# Patient Record
Sex: Female | Born: 1975 | Hispanic: No | Marital: Married | Smoking: Never smoker
Health system: Southern US, Community
[De-identification: ages and names within clinical notes are randomized; demographics above are authoritative.]

## PROBLEM LIST (undated history)

## (undated) DIAGNOSIS — J302 Other seasonal allergic rhinitis: Secondary | ICD-10-CM

## (undated) DIAGNOSIS — E785 Hyperlipidemia, unspecified: Secondary | ICD-10-CM

## (undated) DIAGNOSIS — Z8742 Personal history of other diseases of the female genital tract: Secondary | ICD-10-CM

## (undated) DIAGNOSIS — D649 Anemia, unspecified: Secondary | ICD-10-CM

## (undated) DIAGNOSIS — F419 Anxiety disorder, unspecified: Secondary | ICD-10-CM

## (undated) DIAGNOSIS — R011 Cardiac murmur, unspecified: Secondary | ICD-10-CM

## (undated) HISTORY — DX: Hyperlipidemia, unspecified: E78.5

## (undated) HISTORY — PX: WISDOM TOOTH EXTRACTION: SHX21

## (undated) HISTORY — DX: Personal history of other diseases of the female genital tract: Z87.42

---

## 1998-06-05 ENCOUNTER — Emergency Department (HOSPITAL_COMMUNITY): Admission: EM | Admit: 1998-06-05 | Discharge: 1998-06-06 | Payer: Self-pay | Admitting: Emergency Medicine

## 2002-07-17 ENCOUNTER — Other Ambulatory Visit: Admission: RE | Admit: 2002-07-17 | Discharge: 2002-07-17 | Payer: Self-pay | Admitting: Obstetrics and Gynecology

## 2003-09-23 ENCOUNTER — Other Ambulatory Visit: Admission: RE | Admit: 2003-09-23 | Discharge: 2003-09-23 | Payer: Self-pay | Admitting: Obstetrics and Gynecology

## 2006-08-20 ENCOUNTER — Other Ambulatory Visit: Admission: RE | Admit: 2006-08-20 | Discharge: 2006-08-20 | Payer: Self-pay | Admitting: Obstetrics and Gynecology

## 2011-11-06 ENCOUNTER — Other Ambulatory Visit: Payer: Self-pay | Admitting: Family Medicine

## 2011-11-06 ENCOUNTER — Ambulatory Visit
Admission: RE | Admit: 2011-11-06 | Discharge: 2011-11-06 | Disposition: A | Discharge status: Home / Self Care | Payer: BC Managed Care – PPO | Source: Ambulatory Visit | Attending: Family Medicine | Admitting: Family Medicine

## 2011-11-06 DIAGNOSIS — R05 Cough: Secondary | ICD-10-CM

## 2011-12-06 ENCOUNTER — Other Ambulatory Visit: Payer: Self-pay | Admitting: Obstetrics and Gynecology

## 2011-12-06 DIAGNOSIS — N644 Mastodynia: Secondary | ICD-10-CM

## 2011-12-06 DIAGNOSIS — N6325 Unspecified lump in the left breast, overlapping quadrants: Secondary | ICD-10-CM

## 2011-12-07 ENCOUNTER — Ambulatory Visit
Admission: RE | Admit: 2011-12-07 | Discharge: 2011-12-07 | Disposition: A | Discharge status: Home / Self Care | Payer: BC Managed Care – PPO | Source: Ambulatory Visit | Attending: Obstetrics and Gynecology | Admitting: Obstetrics and Gynecology

## 2011-12-07 DIAGNOSIS — N644 Mastodynia: Secondary | ICD-10-CM

## 2012-01-16 ENCOUNTER — Ambulatory Visit (INDEPENDENT_AMBULATORY_CARE_PROVIDER_SITE_OTHER): Payer: Self-pay

## 2012-01-16 DIAGNOSIS — M542 Cervicalgia: Secondary | ICD-10-CM

## 2012-01-16 DIAGNOSIS — S46819A Strain of other muscles, fascia and tendons at shoulder and upper arm level, unspecified arm, initial encounter: Secondary | ICD-10-CM

## 2012-01-16 DIAGNOSIS — S139XXA Sprain of joints and ligaments of unspecified parts of neck, initial encounter: Secondary | ICD-10-CM

## 2012-01-16 DIAGNOSIS — M25519 Pain in unspecified shoulder: Secondary | ICD-10-CM

## 2012-01-31 ENCOUNTER — Telehealth: Payer: Self-pay

## 2012-01-31 NOTE — Telephone Encounter (Signed)
CALLED PT, HER NAME WASN'T ON VM. CD LEFT UP FRONT BUT I DIDN'T LEAVE MSG. NOT SURE IF IT WAS HERS OR NOT

## 2012-01-31 NOTE — Telephone Encounter (Signed)
.  [facility]  X-RAY REQUESTED LAST WEEK - NOT IN PICK UP DRAWER  PLEASE MAKE COPY PT HAS APPT AT FOUR PM

## 2012-02-23 DIAGNOSIS — Z0271 Encounter for disability determination: Secondary | ICD-10-CM

## 2012-12-10 ENCOUNTER — Ambulatory Visit: Payer: BC Managed Care – PPO | Admitting: Obstetrics and Gynecology

## 2012-12-11 ENCOUNTER — Ambulatory Visit: Payer: BC Managed Care – PPO | Admitting: Obstetrics and Gynecology

## 2013-01-13 ENCOUNTER — Ambulatory Visit: Payer: BC Managed Care – PPO | Admitting: Obstetrics and Gynecology

## 2013-01-21 ENCOUNTER — Ambulatory Visit: Payer: BC Managed Care – PPO | Admitting: Obstetrics and Gynecology

## 2013-01-28 ENCOUNTER — Ambulatory Visit: Payer: BC Managed Care – PPO | Admitting: Obstetrics and Gynecology

## 2013-01-28 ENCOUNTER — Encounter: Payer: Self-pay | Admitting: Obstetrics and Gynecology

## 2013-01-28 VITALS — BP 110/64 | HR 70 | Ht 67.0 in | Wt 154.0 lb

## 2013-01-28 DIAGNOSIS — Z01419 Encounter for gynecological examination (general) (routine) without abnormal findings: Secondary | ICD-10-CM

## 2013-01-28 DIAGNOSIS — Z124 Encounter for screening for malignant neoplasm of cervix: Secondary | ICD-10-CM

## 2013-01-28 NOTE — Progress Notes (Addendum)
Subjective:    Raven Harper is a 37 y.o. female, G0P0, who presents for an annual exam. The patient has questions about contraception.  Has been trying to conceive since April of 2013   Menstrual cycle:   LMP: Patient's last menstrual period was 01/05/2013. Cycle 28 day, flow 7 days with pad change (heaviest days) 3-4 times a day; cramping rated 7/10 on 10 point pain scale relieved with OTC analgesia to 0/10             Review of Systems Pertinent items are noted in HPI. Denies pelvic pain, urinary tract symptoms, vaginitis symptoms, irregular bleeding, menopausal symptoms, change in bowel habits or rectal bleeding   Objective:    BP 110/64  Pulse 70  Ht 5\' 7"  (1.702 m)  Wt 154 lb (69.854 kg)  BMI 24.12 kg/m2  LMP 01/05/2013   Wt Readings from Last 1 Encounters:  01/28/13 154 lb (69.854 kg)   Body mass index is 24.12 kg/(m^2). General Appearance: Alert, no acute distress HEENT: Grossly normal Neck / Thyroid: Supple, no thyromegaly or cervical adenopathy Lungs: Clear to auscultation bilaterally Back: No CVA tenderness Breast Exam: No masses or nodes.No dimpling, nipple retraction or discharge. Cardiovascular: Regular rate and rhythm.  2/6 Murmur at LSB Gastrointestinal: Soft, non-tender, no masses or organomegaly Pelvic Exam: EGBUS-wnl, vagina-normal rugae, cervix- with 3 mm polyp or tenderness, uterus appears normal size shape and consistency, adnexae-no masses or tenderness Rectovaginal: no masses and normal sphincter tone Lymphatic Exam: Non-palpable nodes in neck, clavicular,  axillary, or inguinal regions  Skin: no rashes or abnormalities Extremities: no clubbing cyanosis or edema  Neurologic: grossly normal Psychiatric: Alert and oriented    Assessment:   Routine GYN Exam Infertility   Plan:    PAP sent  Call with first day of next  period for HSG, TSH, day 21 Progesterone, Prolactin  then F/U with M.D. Patient's partner is in [place] indefinitely so getting  a semen analysis is questionable.  RTO 1 year or prn  Victoriano Campion,ELMIRAPA-C

## 2013-01-28 NOTE — Progress Notes (Signed)
Regular Periods: yes Mammogram: yes 12/12  Monthly Breast Ex.: yes Exercise: no  Tetanus < 10 years: yes Seatbelts: yes  NI. Bladder Functn.: yes Abuse at home: no  Daily BM's: yes Stressful Work: yes  Healthy Diet: yes Sigmoid-Colonoscopy: NO  Calcium: no Medical problems this year: ? ABOUT CONCEPTION   LAST PAP:12/12  Contraception: NONE  Mammogram:  12/12  PCP: DR. Cleda Clarks  PMH: NO CHANGE  FMH: NO CHANGE  Last Bone Scan: NO  PT IS SINGLE

## 2013-01-28 NOTE — Patient Instructions (Addendum)
HysterosalpingogramHysterosalpingography Hysterosalpingography is a procedure using an X-ray and contrast dye to look at the inside of your uterus and fallopian tubes. The contrast dye is injected into the uterus through the vagina and cervix while X-ray pictures are taken. This procedure may help your caregiver determine whether you have uterine tumors, adhesions, or structural abnormalities. It is commonly used to help determine reasons why a woman is unable to have children (infertile). LET YOUR CAREGIVER KNOW ABOUT:  Allergies to medicine or food, especially shellfish.  Medicines taken, including vitamins, herbs, eyedrops, over-the-counter medicines, and creams.  Use of steroids (by mouth or creams).  Previous problems with anesthetics or numbing medicines.  History of bleeding problems or blood clots.  Previous pelvic surgery.  Other health problems, including diabetes and kidney problems.  Possibility of pregnancy.  Any allergies to iodine or X-ray dye (contrast dye).  Any recent pelvic infections or sexually transmitted diseases (STDs). RISKS AND COMPLICATIONS   Infection in the lining of the uterus (endometritis) or fallopian tubes (salpingitis).  Damage or puncturing of the uterus or fallopian tubes.  An allergic reaction to the contrast dye used to perform the X-ray. BEFORE THE PROCEDURE   Schedule the procedure after your period stops, but before your next ovulation. This is usually between day 5 and 10 of your last period. Day 1 is the first day of your period.  Ask your caregiver about changing or stopping your regular medicines.  You may eat and drink as normal.  You may be given a medicine to relax you (sedative) or an over-the-counter pain medicine to lessen any discomfort during the procedure.  Empty your bladder before the procedure begins. PROCEDURE  You will lie down on an X-ray table with your feet in stirrups.  A device called a speculum will be  placed into your vagina. This allows your caregiver to see inside your vagina to the cervix.  The cervix will be washed with a special soap.  A thin, flexible tube will be passed through the cervix into the uterus.  Contrast dye will be put into this tube.  Several X-rays will be taken as the contrast dye spreads through the uterus and fallopian tubes.  The tube will be taken out after the procedure.  The procedure usually lasts about 15 to 30 minutes. AFTER THE PROCEDURE   Most of the contrast dye will flow out naturally. You may want to wear a sanitary pad.  You may have cramping and spotting. This should go away in 24 hours.  Ask when your test results will be ready. Make sure you get your test results. Document Released: 01/13/2005 Document Revised: 03/04/2012 Document Reviewed: 10/31/2011 [facility] Patient Information 2013 [place], [place].   Take multivitamins with 400 mcg of folic acid or more

## 2013-01-29 LAB — PAP IG W/ RFLX HPV ASCU

## 2013-01-31 ENCOUNTER — Telehealth: Payer: Self-pay | Admitting: Obstetrics and Gynecology

## 2013-02-03 ENCOUNTER — Telehealth: Payer: Self-pay | Admitting: Obstetrics and Gynecology

## 2013-02-03 ENCOUNTER — Other Ambulatory Visit: Payer: Self-pay

## 2013-02-03 ENCOUNTER — Other Ambulatory Visit: Payer: Self-pay | Admitting: Obstetrics and Gynecology

## 2013-02-03 DIAGNOSIS — N979 Female infertility, unspecified: Secondary | ICD-10-CM

## 2013-02-03 NOTE — Telephone Encounter (Signed)
Spoke with pt menses started Friday. HSG is scheduled Thursday at 8:30 am [facility]. Pt states she will not be able to make that appointment. Informed pt HSG needs to be completed ASAP & ph number was given to pt to r/s. Also informed pt prolactin, Day 21 progesterone, and TSH is needed on Feb 27th per EP. Pt scheduled for lab visit only Feb 27 at 11:15 am. In addition, pt needs to have MD visit a week after blood work is completed. Pt is scheduled with SR at 9:10 am March 6th. Pt agrees and has no further questions.

## 2013-02-06 ENCOUNTER — Other Ambulatory Visit (HOSPITAL_COMMUNITY): Payer: Self-pay

## 2013-02-07 ENCOUNTER — Ambulatory Visit (HOSPITAL_COMMUNITY): Admission: RE | Admit: 2013-02-07 | Payer: BC Managed Care – PPO | Source: Ambulatory Visit

## 2013-02-20 ENCOUNTER — Other Ambulatory Visit: Payer: BC Managed Care – PPO

## 2013-02-27 ENCOUNTER — Encounter: Payer: BC Managed Care – PPO | Admitting: Obstetrics and Gynecology

## 2013-03-04 ENCOUNTER — Telehealth: Payer: Self-pay | Admitting: Obstetrics and Gynecology

## 2013-03-04 NOTE — Telephone Encounter (Signed)
TC to pt. Per EP plan, scheduled HSG 03/07/13 at 1:45 at [facility]. LMP 02/28/13. Pt has not had IC and advised not to do so until after test. States will eb out of town at time of 21 day testing. To call on 1st day of next cycle to schedule and then schedule F/U with MD. Pt verbalizes comprehension.

## 2013-03-06 ENCOUNTER — Telehealth: Payer: Self-pay | Admitting: Obstetrics and Gynecology

## 2013-03-06 ENCOUNTER — Other Ambulatory Visit: Payer: Self-pay | Admitting: Obstetrics and Gynecology

## 2013-03-06 DIAGNOSIS — N979 Female infertility, unspecified: Secondary | ICD-10-CM

## 2013-03-06 NOTE — Telephone Encounter (Signed)
Tc from [facility] at [facility]. Previous order for HSG cannot be attached to current visit. HSG reordered.

## 2013-03-07 ENCOUNTER — Ambulatory Visit (HOSPITAL_COMMUNITY)
Admission: RE | Admit: 2013-03-07 | Discharge: 2013-03-07 | Disposition: A | Discharge status: Home / Self Care | Payer: BC Managed Care – PPO | Source: Ambulatory Visit | Attending: Obstetrics and Gynecology | Admitting: Obstetrics and Gynecology

## 2013-03-07 DIAGNOSIS — N979 Female infertility, unspecified: Secondary | ICD-10-CM | POA: Insufficient documentation

## 2013-03-07 MED ORDER — IOHEXOL 300 MG/ML  SOLN
20.0000 mL | Freq: Once | INTRAMUSCULAR | Status: AC | PRN
  Administered 2013-03-07: 20 mL

## 2013-05-22 ENCOUNTER — Encounter (HOSPITAL_COMMUNITY): Payer: Self-pay | Admitting: Pharmacy Technician

## 2013-05-22 ENCOUNTER — Encounter (HOSPITAL_COMMUNITY): Payer: Self-pay | Admitting: *Deleted

## 2013-06-03 ENCOUNTER — Other Ambulatory Visit (HOSPITAL_COMMUNITY): Payer: Self-pay | Admitting: Obstetrics and Gynecology

## 2013-06-03 NOTE — H&P (Signed)
Raven Harper is a 37 y.o. female G0P0 who presents for hysteroscopic resection of an endometrial polyp.  The patient was seen at [facility] for infertility evaluation and during that process had a hysterosalpingogram that demonstrated a perisisten, non-mobile endometrial fundal body filling defect favoring a submucosal fibroid or polyp.  A follow up sono-hysterogram showed a uterus: 5.80 x 4.63 x 3.5 cm with normal appearing ovaries and a solid mass extending from the fundus of the uterine cavity measuring 1.3 x 0.76 x 0.86 cm.  This finding was confirmed by color doppler flow to the mass and was most consistent with a polyp.  The patient has regular monthly menstrual periods that lasts for 7 days during which time she changes a pad 3-4 times a day.  Though her cramps are rated at a 7/10 on a 10 point pain scale, she finds total relief with OTC analgesia.  She denies any pelvic pain, inter-menstrual or post-coital bleeding, bladder or bowel difficulties.  After reviewing the findings of both the hysterosalpingogram and sono-hysterogram, the patient desires to proceed with removal of the endometrial polyp.  Past Medical History  OB History: G0P0   GYN History: LMP:05/27/2013    Contracepton no method  The patient denies history of sexually transmitted disease.  Denies history of abnormal PAP smear  Last PAP smear: February 2014  Medical History:  Hyperlipidemia  Surgical History:  None  Family History:  Cardiovascular disease,  breast and lung cancers.  Social History: Single; Denies alcohol, tobacco or illicit drug use  Medicines:  NONE   No Known Allergies  ROS: Denies headache, vision changes, nasal congestion, dysphagia, tinnitus, dizziness, hoarseness, cough,  chest pain, shortness of breath, nausea, vomiting, diarrhea,constipation,  urinary frequency, urgency  dysuria, hematuria, vaginitis symptoms, pelvic pain, swelling of joints,easy bruising,  myalgias, arthralgias, skin  rashes, unexplained weight loss and except as is mentioned in the history of present illness, patient's review of systems is otherwise negative.   Physical Exam  Bp   100/66   Height  5'7"  Weight 150 lbs  BMI  23.5  Neck: supple without masses or thyromegaly Lungs: clear to auscultation Heart: regular rate and rhythm Abdomen: soft, non-tender and no organomegaly Pelvic:EGBUS- wnl; vagina-normal rugae; uterus-normal size, cervix without lesions or motion tenderness; adnexae-no tenderness or masses Extremities:  no clubbing, cyanosis or edema   Assesment:  Endometrial Polyp   Disposition:  The procedure was reviewed with patient with expected benefits.  Risks including but not limited to reaction to anesthesia,  bleeding, infection, uterine perforation with possible intra-abdominal organ damage and need to stay overnight +/- require exploratory laparoscopy were also reviewed.  Pre-operative instructions were given to patient along with post-operative recovery and expectations- all questions were answered.  Patient has consented to proceed with Hysteroscopy with Resection of Endometrial Polyp at [facility] [place],  June 05, 2013 at 11:30 a.m.  CSN# [id number]   Nayleen Janosik J. Lowell Guitar, PA-C  for Dr. Crist Fat.Rivard

## 2013-06-05 ENCOUNTER — Encounter (HOSPITAL_COMMUNITY)
Admission: RE | Disposition: A | Discharge status: Home / Self Care | Payer: Self-pay | Source: Ambulatory Visit | Attending: Obstetrics and Gynecology

## 2013-06-05 ENCOUNTER — Encounter (HOSPITAL_COMMUNITY): Payer: Self-pay | Admitting: *Deleted

## 2013-06-05 ENCOUNTER — Ambulatory Visit (HOSPITAL_COMMUNITY): Payer: BC Managed Care – PPO | Admitting: Registered Nurse

## 2013-06-05 ENCOUNTER — Encounter (HOSPITAL_COMMUNITY): Payer: Self-pay | Admitting: Registered Nurse

## 2013-06-05 ENCOUNTER — Ambulatory Visit (HOSPITAL_COMMUNITY)
Admission: RE | Admit: 2013-06-05 | Discharge: 2013-06-05 | Disposition: A | Discharge status: Home / Self Care | Payer: BC Managed Care – PPO | Source: Ambulatory Visit | Attending: Obstetrics and Gynecology | Admitting: Obstetrics and Gynecology

## 2013-06-05 DIAGNOSIS — N840 Polyp of corpus uteri: Secondary | ICD-10-CM | POA: Insufficient documentation

## 2013-06-05 DIAGNOSIS — N979 Female infertility, unspecified: Secondary | ICD-10-CM | POA: Insufficient documentation

## 2013-06-05 HISTORY — DX: Other seasonal allergic rhinitis: J30.2

## 2013-06-05 HISTORY — PX: DILATATION & CURRETTAGE/HYSTEROSCOPY WITH RESECTOCOPE: SHX5572

## 2013-06-05 HISTORY — DX: Cardiac murmur, unspecified: R01.1

## 2013-06-05 LAB — CBC
Hemoglobin: 12.1 g/dL (ref 12.0–15.0)
MCH: 29.9 pg (ref 26.0–34.0)
MCHC: 33.6 g/dL (ref 30.0–36.0)
Platelets: 234 10*3/uL (ref 150–400)
RBC: 4.05 MIL/uL (ref 3.87–5.11)

## 2013-06-05 LAB — PREGNANCY, URINE: Preg Test, Ur: NEGATIVE

## 2013-06-05 SURGERY — DILATATION & CURETTAGE/HYSTEROSCOPY WITH RESECTOCOPE
Anesthesia: General | Site: Vagina | Laterality: Bilateral | Wound class: Clean Contaminated

## 2013-06-05 MED ORDER — CHLOROPROCAINE HCL 1 % IJ SOLN
INTRAMUSCULAR | Status: AC
  Filled 2013-06-05: qty 30

## 2013-06-05 MED ORDER — FENTANYL CITRATE 0.05 MG/ML IJ SOLN
INTRAMUSCULAR | Status: AC
  Filled 2013-06-05: qty 5

## 2013-06-05 MED ORDER — FENTANYL CITRATE 0.05 MG/ML IJ SOLN
INTRAMUSCULAR | Status: AC
  Administered 2013-06-05: 50 ug via INTRAVENOUS
  Filled 2013-06-05: qty 2

## 2013-06-05 MED ORDER — KETOROLAC TROMETHAMINE 30 MG/ML IJ SOLN: 15.0000 mg | Freq: Once | INTRAMUSCULAR | Status: DC | PRN

## 2013-06-05 MED ORDER — GLYCINE 1.5 % IR SOLN
Status: DC | PRN
  Administered 2013-06-05: 3000 mL

## 2013-06-05 MED ORDER — KETOROLAC TROMETHAMINE 30 MG/ML IJ SOLN
INTRAMUSCULAR | Status: DC | PRN
  Administered 2013-06-05: 30 mg via INTRAVENOUS

## 2013-06-05 MED ORDER — PROPOFOL 10 MG/ML IV BOLUS
INTRAVENOUS | Status: DC | PRN
  Administered 2013-06-05: 200 mg via INTRAVENOUS

## 2013-06-05 MED ORDER — MIDAZOLAM HCL 2 MG/2ML IJ SOLN
INTRAMUSCULAR | Status: AC
  Filled 2013-06-05: qty 2

## 2013-06-05 MED ORDER — LIDOCAINE HCL (CARDIAC) 20 MG/ML IV SOLN
INTRAVENOUS | Status: DC | PRN
  Administered 2013-06-05: 50 mg via INTRAVENOUS

## 2013-06-05 MED ORDER — MIDAZOLAM HCL 2 MG/2ML IJ SOLN: 0.5000 mg | Freq: Once | INTRAMUSCULAR | Status: DC | PRN

## 2013-06-05 MED ORDER — LIDOCAINE HCL (CARDIAC) 20 MG/ML IV SOLN
INTRAVENOUS | Status: AC
  Filled 2013-06-05: qty 5

## 2013-06-05 MED ORDER — DEXAMETHASONE SODIUM PHOSPHATE 10 MG/ML IJ SOLN
INTRAMUSCULAR | Status: AC
  Filled 2013-06-05: qty 1

## 2013-06-05 MED ORDER — LACTATED RINGERS IV SOLN
INTRAVENOUS | Status: DC
  Administered 2013-06-05: 50 mL/h via INTRAVENOUS
  Administered 2013-06-05: 11:00:00 via INTRAVENOUS

## 2013-06-05 MED ORDER — CEFAZOLIN SODIUM-DEXTROSE 2-3 GM-% IV SOLR
2.0000 g | Freq: Once | INTRAVENOUS | Status: AC
  Administered 2013-06-05: 2 g via INTRAVENOUS
  Filled 2013-06-05: qty 50

## 2013-06-05 MED ORDER — CEFAZOLIN SODIUM-DEXTROSE 2-3 GM-% IV SOLR
INTRAVENOUS | Status: AC
  Filled 2013-06-05: qty 50

## 2013-06-05 MED ORDER — ONDANSETRON HCL 4 MG/2ML IJ SOLN
INTRAMUSCULAR | Status: AC
  Filled 2013-06-05: qty 2

## 2013-06-05 MED ORDER — PROMETHAZINE HCL 25 MG/ML IJ SOLN: 6.2500 mg | INTRAMUSCULAR | Status: DC | PRN

## 2013-06-05 MED ORDER — MEPERIDINE HCL 25 MG/ML IJ SOLN: 6.2500 mg | INTRAMUSCULAR | Status: DC | PRN

## 2013-06-05 MED ORDER — FENTANYL CITRATE 0.05 MG/ML IJ SOLN: 25.0000 ug | INTRAMUSCULAR | Status: DC | PRN

## 2013-06-05 MED ORDER — DEXAMETHASONE SODIUM PHOSPHATE 10 MG/ML IJ SOLN
INTRAMUSCULAR | Status: DC | PRN
  Administered 2013-06-05: 10 mg via INTRAVENOUS

## 2013-06-05 MED ORDER — FENTANYL CITRATE 0.05 MG/ML IJ SOLN
INTRAMUSCULAR | Status: DC | PRN
  Administered 2013-06-05 (×3): 50 ug via INTRAVENOUS

## 2013-06-05 MED ORDER — CHLOROPROCAINE HCL 1 % IJ SOLN
INTRAMUSCULAR | Status: DC | PRN
  Administered 2013-06-05: 20 mL

## 2013-06-05 MED ORDER — KETOROLAC TROMETHAMINE 30 MG/ML IJ SOLN
INTRAMUSCULAR | Status: AC
  Filled 2013-06-05: qty 1

## 2013-06-05 MED ORDER — ONDANSETRON HCL 4 MG/2ML IJ SOLN
INTRAMUSCULAR | Status: DC | PRN
  Administered 2013-06-05: 4 mg via INTRAVENOUS

## 2013-06-05 MED ORDER — MIDAZOLAM HCL 5 MG/5ML IJ SOLN
INTRAMUSCULAR | Status: DC | PRN
  Administered 2013-06-05: 2 mg via INTRAVENOUS

## 2013-06-05 MED ORDER — PROPOFOL 10 MG/ML IV EMUL
INTRAVENOUS | Status: AC
  Filled 2013-06-05: qty 20

## 2013-06-05 SURGICAL SUPPLY — 20 items
BOOTIES KNEE HIGH SLOAN (MISCELLANEOUS) ×4 IMPLANT
CANISTER SUCTION 2500CC (MISCELLANEOUS) ×2 IMPLANT
CATH ROBINSON RED A/P 16FR (CATHETERS) ×2 IMPLANT
CLOTH BEACON ORANGE TIMEOUT ST (SAFETY) ×2 IMPLANT
CONTAINER PREFILL 10% NBF 60ML (FORM) ×4 IMPLANT
CORD ACTIVE DISPOSABLE (ELECTRODE) ×1
CORD ELECTRO ACTIVE DISP (ELECTRODE) ×1 IMPLANT
DRESSING TELFA 8X3 (GAUZE/BANDAGES/DRESSINGS) ×2 IMPLANT
ELECT LOOP GYNE PRO 24FR (CUTTING LOOP) ×2
ELECT REM PT RETURN 9FT ADLT (ELECTROSURGICAL) ×2
ELECT VAPORTRODE GRVD BAR (ELECTRODE) IMPLANT
ELECTRODE LOOP GYNE PRO 24FR (CUTTING LOOP) IMPLANT
ELECTRODE REM PT RTRN 9FT ADLT (ELECTROSURGICAL) ×1 IMPLANT
GLOVE BIOGEL PI IND STRL 7.0 (GLOVE) ×1 IMPLANT
GLOVE BIOGEL PI INDICATOR 7.0 (GLOVE) ×1
GOWN STRL REIN XL XLG (GOWN DISPOSABLE) ×4 IMPLANT
PACK HYSTEROSCOPY LF (CUSTOM PROCEDURE TRAY) ×2 IMPLANT
PAD OB MATERNITY 4.3X12.25 (PERSONAL CARE ITEMS) ×2 IMPLANT
TOWEL OR 17X24 6PK STRL BLUE (TOWEL DISPOSABLE) ×2 IMPLANT
WATER STERILE IRR 1000ML POUR (IV SOLUTION) ×2 IMPLANT

## 2013-06-05 NOTE — Interval H&P Note (Signed)
History and Physical Interval Note:  06/05/2013 11:07 AM  Raven Harper  has presented today for surgery, with the diagnosis of endometrial Polyp  The various methods of treatment have been discussed with the patient and family. After consideration of risks, benefits and other options for treatment, the patient has consented to  Procedure(s): DILATATION & CURETTAGE/HYSTEROSCOPY WITH RESECTOCOPE (Bilateral) as a surgical intervention .  The patient's history has been reviewed, patient examined, no change in status, stable for surgery.  I have reviewed the patient's chart and labs.  Questions were answered to the patient's satisfaction.     Kristy Catoe A

## 2013-06-05 NOTE — Preoperative (Signed)
Beta Blockers   Reason not to administer Beta Blockers:Not Applicable 

## 2013-06-05 NOTE — Anesthesia Postprocedure Evaluation (Signed)
Anesthesia Post Note  Patient: Raven Harper  Procedure(s) Performed: Procedure(s) (LRB): DILATATION & CURETTAGE/HYSTEROSCOPY WITH RESECTOCOPE (Bilateral)  Anesthesia type: General  Patient location: PACU  Post pain: Pain level controlled  Post assessment: Post-op Vital signs reviewed  Last Vitals:  Filed Vitals:   06/05/13 1230  BP: 106/73  Pulse: 75  Temp:   Resp: 16    Post vital signs: Reviewed  Level of consciousness: sedated  Complications: No apparent anesthesia complications

## 2013-06-05 NOTE — Op Note (Signed)
Preop diagnosis: endometrial polyp  Postop diagnosis: same  Anesthesia: general  Anesthesiologist: Dr. Brayton Caves  Procedure: Hysteroscopy, resection of endometrial polyps and curettage  Surgeon: Dr. Dois Davenport Kamariya Blevens  Procedure: After being informed of the planned procedure with possible complications including bleeding, infection and uterine perforation, informed consent was obtained and patient was taken to or #8.  She was given general anesthesia without complication. She was placed in a dorsal decubitus position, prepped and draped in the sterile fashion and a Foley catheter was inserted in her bladder. Pelvic exam reveals anteverted normal size uterus with 2 normal adnexa.  A weighted speculum is inserted in the vagina. The cervix was grasped with a tenaculum forcep placed on the anterior lip.We proceed with a paracervical block using 1% Nesacaine, 20 cc. Uterus is sounded at 8. The cervix is then easily dilated using Hegar dilator until # 31. This allows for easy placement of a diagnostic hysteroscope. With perfusion of Glycine at a maximum pressure of 75 mmHg, we are able to evaluate the entire uterine cavity.  Observation: a 1 cm posterior polyp and a 0.5 cm right lower uterine polyp are identified. Both tubal ostia are visualized.  We change our diagnostic hysteroscope for a operative hysteroscope and proceed with the resection of the 2 polyps without difficulty. We then removed our instrumentation. Using a sharp curette, we proceed with curettage of the endometrial cavity which returns a small amount of normal-appearing endometrium.  Instruments are then removed. Instrument and sponge count is complete x2. Estimated blood loss is minimal. Water deficit is 75 cc of Glycine.  The procedure is very well tolerated by the patient who is taken to recovery room in a well and stable condition.  Specimen: Endometrial polyps and endometrial curettings sent to pathology.

## 2013-06-05 NOTE — H&P (View-Only) (Signed)
Raven Harper is a 37 y.o. female G0P0 who presents for hysteroscopic resection of an endometrial polyp.  The patient was seen at [facility] for infertility evaluation and during that process had a hysterosalpingogram that demonstrated a perisisten, non-mobile endometrial fundal body filling defect favoring a submucosal fibroid or polyp.  A follow up sono-hysterogram showed a uterus: 5.80 x 4.63 x 3.5 cm with normal appearing ovaries and a solid mass extending from the fundus of the uterine cavity measuring 1.3 x 0.76 x 0.86 cm.  This finding was confirmed by color doppler flow to the mass and was most consistent with a polyp.  The patient has regular monthly menstrual periods that lasts for 7 days during which time she changes a pad 3-4 times a day.  Though her cramps are rated at a 7/10 on a 10 point pain scale, she finds total relief with OTC analgesia.  She denies any pelvic pain, inter-menstrual or post-coital bleeding, bladder or bowel difficulties.  After reviewing the findings of both the hysterosalpingogram and sono-hysterogram, the patient desires to proceed with removal of the endometrial polyp.  Past Medical History  OB History: G0P0   GYN History: LMP:05/27/2013    Contracepton no method  The patient denies history of sexually transmitted disease.  Denies history of abnormal PAP smear  Last PAP smear: February 2014  Medical History:  Hyperlipidemia  Surgical History:  None  Family History:  Cardiovascular disease,  breast and lung cancers.  Social History: Single; Denies alcohol, tobacco or illicit drug use  Medicines:  NONE   No Known Allergies  ROS: Denies headache, vision changes, nasal congestion, dysphagia, tinnitus, dizziness, hoarseness, cough,  chest pain, shortness of breath, nausea, vomiting, diarrhea,constipation,  urinary frequency, urgency  dysuria, hematuria, vaginitis symptoms, pelvic pain, swelling of joints,easy bruising,  myalgias, arthralgias, skin  rashes, unexplained weight loss and except as is mentioned in the history of present illness, patient's review of systems is otherwise negative.   Physical Exam  Bp   100/66   Height  5'7"  Weight 150 lbs  BMI  23.5  Neck: supple without masses or thyromegaly Lungs: clear to auscultation Heart: regular rate and rhythm Abdomen: soft, non-tender and no organomegaly Pelvic:EGBUS- wnl; vagina-normal rugae; uterus-normal size, cervix without lesions or motion tenderness; adnexae-no tenderness or masses Extremities:  no clubbing, cyanosis or edema   Assesment:  Endometrial Polyp   Disposition:  The procedure was reviewed with patient with expected benefits.  Risks including but not limited to reaction to anesthesia,  bleeding, infection, uterine perforation with possible intra-abdominal organ damage and need to stay overnight +/- require exploratory laparoscopy were also reviewed.  Pre-operative instructions were given to patient along with post-operative recovery and expectations- all questions were answered.  Patient has consented to proceed with Hysteroscopy with Resection of Endometrial Polyp at [facility],  June 05, 2013 at 11:30 a.m.  CSN# [id number]   Brynna Dobos J. Abbey Veith, PA-C  for Dr. Sandra A.Rivard   

## 2013-06-05 NOTE — Transfer of Care (Signed)
Immediate Anesthesia Transfer of Care Note  Patient: Raven Harper  Procedure(s) Performed: Procedure(s): DILATATION & CURETTAGE/HYSTEROSCOPY WITH RESECTOCOPE (Bilateral)  Patient Location: PACU  Anesthesia Type:General  Level of Consciousness: sedated  Airway & Oxygen Therapy: Patient Spontanous Breathing and Patient connected to nasal cannula oxygen  Post-op Assessment: Report given to PACU RN  Post vital signs: Reviewed  Complications: No apparent anesthesia complications

## 2013-06-05 NOTE — Anesthesia Preprocedure Evaluation (Signed)
Anesthesia Evaluation  Patient identified by MRN, date of birth, ID band Patient awake    Reviewed: Allergy & Precautions, H&P , Patient's Chart, lab work & pertinent test results, reviewed documented beta blocker date and time   History of Anesthesia Complications Negative for: history of anesthetic complications  Airway Mallampati: II TM Distance: >3 FB Neck ROM: full    Dental no notable dental hx.    Pulmonary neg pulmonary ROS,  breath sounds clear to auscultation  Pulmonary exam normal       Cardiovascular Exercise Tolerance: Good negative cardio ROS  Rhythm:regular Rate:Normal     Neuro/Psych negative neurological ROS  negative psych ROS   GI/Hepatic negative GI ROS, Neg liver ROS,   Endo/Other  negative endocrine ROS  Renal/GU negative Renal ROS     Musculoskeletal   Abdominal   Peds  Hematology negative hematology ROS (+)   Anesthesia Other Findings   Reproductive/Obstetrics negative OB ROS                           Anesthesia Physical Anesthesia Plan  ASA: II  Anesthesia Plan: General LMA   Post-op Pain Management:    Induction:   Airway Management Planned:   Additional Equipment:   Intra-op Plan:   Post-operative Plan:   Informed Consent: I have reviewed the patients History and Physical, chart, labs and discussed the procedure including the risks, benefits and alternatives for the proposed anesthesia with the patient or authorized representative who has indicated his/her understanding and acceptance.   Dental Advisory Given  Plan Discussed with: CRNA, Surgeon and Anesthesiologist  Anesthesia Plan Comments:         Anesthesia Quick Evaluation  

## 2013-06-06 ENCOUNTER — Encounter (HOSPITAL_COMMUNITY): Payer: Self-pay | Admitting: Obstetrics and Gynecology

## 2014-11-27 ENCOUNTER — Other Ambulatory Visit: Payer: Self-pay

## 2014-11-27 DIAGNOSIS — Z1231 Encounter for screening mammogram for malignant neoplasm of breast: Secondary | ICD-10-CM

## 2014-12-14 ENCOUNTER — Ambulatory Visit: Payer: Self-pay

## 2014-12-14 ENCOUNTER — Ambulatory Visit
Admission: RE | Admit: 2014-12-14 | Discharge: 2014-12-14 | Disposition: A | Discharge status: Home / Self Care | Payer: BC Managed Care – PPO | Source: Ambulatory Visit

## 2014-12-14 DIAGNOSIS — Z1231 Encounter for screening mammogram for malignant neoplasm of breast: Secondary | ICD-10-CM

## 2014-12-16 ENCOUNTER — Other Ambulatory Visit: Payer: Self-pay | Admitting: Obstetrics and Gynecology

## 2014-12-16 DIAGNOSIS — R928 Other abnormal and inconclusive findings on diagnostic imaging of breast: Secondary | ICD-10-CM

## 2015-01-01 ENCOUNTER — Ambulatory Visit
Admission: RE | Admit: 2015-01-01 | Discharge: 2015-01-01 | Disposition: A | Discharge status: Home / Self Care | Payer: BLUE CROSS/BLUE SHIELD | Source: Ambulatory Visit | Attending: Obstetrics and Gynecology | Admitting: Obstetrics and Gynecology

## 2015-01-01 DIAGNOSIS — R928 Other abnormal and inconclusive findings on diagnostic imaging of breast: Secondary | ICD-10-CM

## 2015-02-02 ENCOUNTER — Ambulatory Visit (INDEPENDENT_AMBULATORY_CARE_PROVIDER_SITE_OTHER): Payer: BLUE CROSS/BLUE SHIELD | Admitting: Family Medicine

## 2015-02-02 VITALS — BP 100/64 | HR 58 | Temp 98.5°F | Resp 16 | Ht 66.5 in | Wt 139.2 lb

## 2015-02-02 DIAGNOSIS — J988 Other specified respiratory disorders: Secondary | ICD-10-CM

## 2015-02-02 DIAGNOSIS — J22 Unspecified acute lower respiratory infection: Secondary | ICD-10-CM

## 2015-02-02 DIAGNOSIS — R0789 Other chest pain: Secondary | ICD-10-CM

## 2015-02-02 DIAGNOSIS — R059 Cough, unspecified: Secondary | ICD-10-CM

## 2015-02-02 DIAGNOSIS — R05 Cough: Secondary | ICD-10-CM

## 2015-02-02 MED ORDER — AZITHROMYCIN 250 MG PO TABS: ORAL_TABLET | ORAL | Status: DC

## 2015-02-02 MED ORDER — ALBUTEROL SULFATE HFA 108 (90 BASE) MCG/ACT IN AERS: 1.0000 | INHALATION_SPRAY | RESPIRATORY_TRACT | Status: DC | PRN

## 2015-02-02 MED ORDER — BENZONATATE 100 MG PO CAPS: 100.0000 mg | ORAL_CAPSULE | Freq: Three times a day (TID) | ORAL | Status: DC | PRN

## 2015-02-02 MED ORDER — HYDROCODONE-HOMATROPINE 5-1.5 MG/5ML PO SYRP: ORAL_SOLUTION | ORAL | Status: DC

## 2015-02-02 NOTE — Patient Instructions (Addendum)
Tessalon, drink plenty of fluids and cough drops during day for cough, hydrocodone at night if needed (do not use hydrocodone if you are wheezing or short of breath - albuterol would be used for this). Start antibiotic - azithromycin.  IF you are tight or wheezing - can use albuterol inhaler, but no wheeze heard on your exam today. If frequent use of  Inhaler needed - return for recheck.  If you are not improving in next week - return for recheck. Return to the clinic or go to the nearest emergency room if any of your symptoms worsen or new symptoms occur.   Acute Bronchitis Bronchitis is inflammation of the airways that extend from the windpipe into the lungs (bronchi). The inflammation often causes mucus to develop. This leads to a cough, which is the most common symptom of bronchitis.  In acute bronchitis, the condition usually develops suddenly and goes away over time, usually in a couple weeks. Smoking, allergies, and asthma can make bronchitis worse. Repeated episodes of bronchitis may cause further lung problems.  CAUSES Acute bronchitis is most often caused by the same virus that causes a cold. The virus can spread from person to person (contagious) through coughing, sneezing, and touching contaminated objects. SIGNS AND SYMPTOMS   Cough.   Fever.   Coughing up mucus.   Body aches.   Chest congestion.   Chills.   Shortness of breath.   Sore throat.  DIAGNOSIS  Acute bronchitis is usually diagnosed through a physical exam. Your health care provider will also ask you questions about your medical history. Tests, such as chest X-rays, are sometimes done to rule out other conditions.  TREATMENT  Acute bronchitis usually goes away in a couple weeks. Oftentimes, no medical treatment is necessary. Medicines are sometimes given for relief of fever or cough. Antibiotic medicines are usually not needed but may be prescribed in certain situations. In some cases, an inhaler may be  recommended to help reduce shortness of breath and control the cough. A cool mist vaporizer may also be used to help thin bronchial secretions and make it easier to clear the chest.  HOME CARE INSTRUCTIONS  Get plenty of rest.   Drink enough fluids to keep your urine clear or pale yellow (unless you have a medical condition that requires fluid restriction). Increasing fluids may help thin your respiratory secretions (sputum) and reduce chest congestion, and it will prevent dehydration.   Take medicines only as directed by your health care provider.  If you were prescribed an antibiotic medicine, finish it all even if you start to feel better.  Avoid smoking and secondhand smoke. Exposure to cigarette smoke or irritating chemicals will make bronchitis worse. If you are a smoker, consider using nicotine gum or skin patches to help control withdrawal symptoms. Quitting smoking will help your lungs heal faster.   Reduce the chances of another bout of acute bronchitis by washing your hands frequently, avoiding people with cold symptoms, and trying not to touch your hands to your mouth, nose, or eyes.   Keep all follow-up visits as directed by your health care provider.  SEEK MEDICAL CARE IF: Your symptoms do not improve after 1 week of treatment.  SEEK IMMEDIATE MEDICAL CARE IF:  You develop an increased fever or chills.   You have chest pain.   You have severe shortness of breath.  You have bloody sputum.   You develop dehydration.  You faint or repeatedly feel like you are going to pass out.  You develop repeated vomiting.  You develop a severe headache. MAKE SURE YOU:   Understand these instructions.  Will watch your condition.  Will get help right away if you are not doing well or get worse. Document Released: 01/18/2005 Document Revised: 04/27/2014 Document Reviewed: 06/03/2013 [facility] Patient Information 2015 [place], [place]. This information is not intended to  replace advice given to you by your health care provider. Make sure you discuss any questions you have with your health care provider.   Chest Wall Pain Chest wall pain is pain in or around the bones and muscles of your chest. It may take up to 6 weeks to get better. It may take longer if you must stay physically active in your work and activities.  CAUSES  Chest wall pain may happen on its own. However, it may be caused by:  A viral illness like the flu.  Injury.  Coughing.  Exercise.  Arthritis.  Fibromyalgia.  Shingles. HOME CARE INSTRUCTIONS   Avoid overtiring physical activity. Try not to strain or perform activities that cause pain. This includes any activities using your chest or your abdominal and side muscles, especially if heavy weights are used.  Put ice on the sore area.  Put ice in a plastic bag.  Place a towel between your skin and the bag.  Leave the ice on for 15-20 minutes per hour while awake for the first 2 days.  Only take over-the-counter or prescription medicines for pain, discomfort, or fever as directed by your caregiver. SEEK IMMEDIATE MEDICAL CARE IF:   Your pain increases, or you are very uncomfortable.  You have a fever.  Your chest pain becomes worse.  You have new, unexplained symptoms.  You have nausea or vomiting.  You feel sweaty or lightheaded.  You have a cough with phlegm (sputum), or you cough up blood. MAKE SURE YOU:   Understand these instructions.  Will watch your condition.  Will get help right away if you are not doing well or get worse. Document Released: 12/11/2005 Document Revised: 03/04/2012 Document Reviewed: 08/07/2011 [facility] Patient Information 2015 [place], [place]. This information is not intended to replace advice given to you by your health care provider. Make sure you discuss any questions you have with your health care provider.

## 2015-02-02 NOTE — Progress Notes (Signed)
Subjective:    Patient ID: Raven Harper, female    DOB: Nov 08, 1976, 39 y.o.   MRN: [id number] This chart was scribed for Merri Ray, MD by Zola Button, Medical Scribe. This patient was seen in Room 11 and the patient's care was started at 11:23 AM.    HPI HPI Comments: Raven Harper is a 39 y.o. female with a hx of heart murmur who presents to the [facility] complaining of gradual onset URI symptoms that started about 3 weeks ago. Patient reports having persistent productive cough with clear/yellow sputum and chest congestion. She also reports having right-sided chest wall pain and chest tightness that started 2-3 nights ago, worsening with cough. Her fiance has had a cough since mid December and her son also had some URI symptoms, but her son's symptoms have since resolved. She saw an on-site doctor at work 1 week ago; she was given nasal spray and hydrocodone cough syrup. She only used the nasal spray for one day. Patient also tried her fiance's prednisone since 2 nights ago; she has taken 40 mg per night for the past 2 nights. She notes having some wheezing since she started using the prednisone. Patient denies fever and generalized myalgias. She also denies hx of MI and asthma; however, she was given an inhaler a few years ago for a cough with improvement.  Patient works as a Counsellor at [employer].   There are no active problems to display for this patient.  Past Medical History  Diagnosis Date  . H/O menorrhagia   . Hyperlipidemia     H/O  . Heart murmur     no problems  . Seasonal allergies    Past Surgical History  Procedure Laterality Date  . Wisdom tooth extraction    . Dilatation & currettage/hysteroscopy with resectocope Bilateral 06/05/2013    Procedure: DILATATION & CURETTAGE/HYSTEROSCOPY WITH RESECTOCOPE;  Surgeon: Alwyn Pea, MD;  Location: [facility];  Service: Gynecology;  Laterality: Bilateral;   Allergies  Allergen Reactions  .  Latex Other (See Comments)    Patient states that she is sensitive to latex.   Prior to Admission medications   Medication Sig Start Date End Date Taking? Authorizing Provider  Biotin 5000 MCG TABS Take 1 tablet by mouth daily.   Yes Historical Provider, MD  Multiple Vitamin (MULTIVITAMIN) tablet Take 1 tablet by mouth daily.   Yes Historical Provider, MD  predniSONE (DELTASONE) 20 MG tablet Take 20 mg by mouth 2 (two) times daily.   Yes Historical Provider, MD   History   Social History  . Marital Status: Single    Spouse Name: N/A    Number of Children: N/A  . Years of Education: N/A   Occupational History  . Not on file.   Social History Main Topics  . Smoking status: Never Smoker   . Smokeless tobacco: Never Used  . Alcohol Use: 0.6 oz/week    1 Glasses of wine per week  . Drug Use: No  . Sexual Activity: Yes    Birth Control/ Protection: None   Other Topics Concern  . Not on file   Social History Narrative     Review of Systems  Constitutional: Negative for fever.  HENT: Positive for congestion.   Respiratory: Positive for cough, chest tightness and wheezing.   Cardiovascular: Positive for chest pain.  Musculoskeletal: Negative for myalgias.       Objective:   Physical Exam  Constitutional: She is oriented to person, place,  and time. She appears well-developed and well-nourished. No distress.  HENT:  Head: Normocephalic and atraumatic.  Right Ear: Hearing, tympanic membrane, external ear and ear canal normal.  Left Ear: Hearing, tympanic membrane, external ear and ear canal normal.  Nose: Nose normal.  Mouth/Throat: Oropharynx is clear and moist. No oropharyngeal exudate.  Eyes: Conjunctivae and EOM are normal. Pupils are equal, round, and reactive to light.  Cardiovascular: Normal rate, regular rhythm and intact distal pulses.   Murmur heard.  Systolic murmur is present with a grade of 2/6  Grade 2 systolic murmur.  Pulmonary/Chest: Effort normal and  breath sounds normal. No respiratory distress. She has no wheezes. She has no rhonchi. She exhibits tenderness.  Reproduced chest wall pain with palpation of right lateral chest.  Neurological: She is alert and oriented to person, place, and time.  Skin: Skin is warm and dry. No rash noted.  Psychiatric: She has a normal mood and affect. Her behavior is normal.  Vitals reviewed.     Filed Vitals:   02/02/15 1057  BP: 100/64  Pulse: 58  Temp: 98.5 F (36.9 C)  TempSrc: Oral  Resp: 16  Height: 5' 6.5" (1.689 m)  Weight: 139 lb 3.2 oz (63.141 kg)  SpO2: 100%       Assessment & Plan:   Raven Harper is a 39 y.o. female Cough - Plan: benzonatate (TESSALON) 100 MG capsule, HYDROcodone-homatropine (HYCODAN) 5-1.5 MG/5ML syrup, albuterol (PROVENTIL HFA;VENTOLIN HFA) 108 (90 BASE) MCG/ACT inhaler  Chest wall pain  LRTI (lower respiratory tract infection) - Plan: azithromycin (ZITHROMAX) 250 MG tablet  Suspected bronchitis/LRTI with subsequent chest wall pain form coughing. Discussed CXR or EKG today, but doubt cardiac source and these were declined for now. No wheeze on exam, but had prednisone past 2 nights and possible hx of reactive airway.   -zpak, tessalon during day, hycodan at night if needed, and if any wheeze - can take albuterol.  If peristent albuterol use - rtc.   -advil or alleve for chest wall pain and if this is not improving with cough suppression or time - rtc for further eval or possible imaging.   -rtc/er precautions.    Meds ordered this encounter  Medications  . DISCONTD: predniSONE (DELTASONE) 20 MG tablet    Sig: Take 20 mg by mouth 2 (two) times daily.  Marland Kitchen azithromycin (ZITHROMAX) 250 MG tablet    Sig: Take 2 pills by mouth on day 1, then 1 pill by mouth per day on days 2 through 5.    Dispense:  6 tablet    Refill:  0  . benzonatate (TESSALON) 100 MG capsule    Sig: Take 1 capsule (100 mg total) by mouth 3 (three) times daily as needed for cough.     Dispense:  20 capsule    Refill:  0  . HYDROcodone-homatropine (HYCODAN) 5-1.5 MG/5ML syrup    Sig: 17m by mouth a bedtime as needed for cough.    Dispense:  120 mL    Refill:  0  . albuterol (PROVENTIL HFA;VENTOLIN HFA) 108 (90 BASE) MCG/ACT inhaler    Sig: Inhale 1-2 puffs into the lungs every 4 (four) hours as needed for wheezing or shortness of breath.    Dispense:  1 Inhaler    Refill:  0   Patient Instructions  Tessalon, drink plenty of fluids and cough drops during day for cough, hydrocodone at night if needed (do not use hydrocodone if you are wheezing or short of breath -  albuterol would be used for this). Start antibiotic - azithromycin.  IF you are tight or wheezing - can use albuterol inhaler, but no wheeze heard on your exam today. If frequent use of  Inhaler needed - return for recheck.  If you are not improving in next week - return for recheck. Return to the clinic or go to the nearest emergency room if any of your symptoms worsen or new symptoms occur.   Acute Bronchitis Bronchitis is inflammation of the airways that extend from the windpipe into the lungs (bronchi). The inflammation often causes mucus to develop. This leads to a cough, which is the most common symptom of bronchitis.  In acute bronchitis, the condition usually develops suddenly and goes away over time, usually in a couple weeks. Smoking, allergies, and asthma can make bronchitis worse. Repeated episodes of bronchitis may cause further lung problems.  CAUSES Acute bronchitis is most often caused by the same virus that causes a cold. The virus can spread from person to person (contagious) through coughing, sneezing, and touching contaminated objects. SIGNS AND SYMPTOMS   Cough.   Fever.   Coughing up mucus.   Body aches.   Chest congestion.   Chills.   Shortness of breath.   Sore throat.  DIAGNOSIS  Acute bronchitis is usually diagnosed through a physical exam. Your health care provider  will also ask you questions about your medical history. Tests, such as chest X-rays, are sometimes done to rule out other conditions.  TREATMENT  Acute bronchitis usually goes away in a couple weeks. Oftentimes, no medical treatment is necessary. Medicines are sometimes given for relief of fever or cough. Antibiotic medicines are usually not needed but may be prescribed in certain situations. In some cases, an inhaler may be recommended to help reduce shortness of breath and control the cough. A cool mist vaporizer may also be used to help thin bronchial secretions and make it easier to clear the chest.  HOME CARE INSTRUCTIONS  Get plenty of rest.   Drink enough fluids to keep your urine clear or pale yellow (unless you have a medical condition that requires fluid restriction). Increasing fluids may help thin your respiratory secretions (sputum) and reduce chest congestion, and it will prevent dehydration.   Take medicines only as directed by your health care provider.  If you were prescribed an antibiotic medicine, finish it all even if you start to feel better.  Avoid smoking and secondhand smoke. Exposure to cigarette smoke or irritating chemicals will make bronchitis worse. If you are a smoker, consider using nicotine gum or skin patches to help control withdrawal symptoms. Quitting smoking will help your lungs heal faster.   Reduce the chances of another bout of acute bronchitis by washing your hands frequently, avoiding people with cold symptoms, and trying not to touch your hands to your mouth, nose, or eyes.   Keep all follow-up visits as directed by your health care provider.  SEEK MEDICAL CARE IF: Your symptoms do not improve after 1 week of treatment.  SEEK IMMEDIATE MEDICAL CARE IF:  You develop an increased fever or chills.   You have chest pain.   You have severe shortness of breath.  You have bloody sputum.   You develop dehydration.  You faint or repeatedly  feel like you are going to pass out.  You develop repeated vomiting.  You develop a severe headache. MAKE SURE YOU:   Understand these instructions.  Will watch your condition.  Will get help right  away if you are not doing well or get worse. Document Released: 01/18/2005 Document Revised: 04/27/2014 Document Reviewed: 06/03/2013 [facility] Patient Information 2015 [place], [place]. This information is not intended to replace advice given to you by your health care provider. Make sure you discuss any questions you have with your health care provider.   Chest Wall Pain Chest wall pain is pain in or around the bones and muscles of your chest. It may take up to 6 weeks to get better. It may take longer if you must stay physically active in your work and activities.  CAUSES  Chest wall pain may happen on its own. However, it may be caused by:  A viral illness like the flu.  Injury.  Coughing.  Exercise.  Arthritis.  Fibromyalgia.  Shingles. HOME CARE INSTRUCTIONS   Avoid overtiring physical activity. Try not to strain or perform activities that cause pain. This includes any activities using your chest or your abdominal and side muscles, especially if heavy weights are used.  Put ice on the sore area.  Put ice in a plastic bag.  Place a towel between your skin and the bag.  Leave the ice on for 15-20 minutes per hour while awake for the first 2 days.  Only take over-the-counter or prescription medicines for pain, discomfort, or fever as directed by your caregiver. SEEK IMMEDIATE MEDICAL CARE IF:   Your pain increases, or you are very uncomfortable.  You have a fever.  Your chest pain becomes worse.  You have new, unexplained symptoms.  You have nausea or vomiting.  You feel sweaty or lightheaded.  You have a cough with phlegm (sputum), or you cough up blood. MAKE SURE YOU:   Understand these instructions.  Will watch your condition.  Will get help right  away if you are not doing well or get worse. Document Released: 12/11/2005 Document Revised: 03/04/2012 Document Reviewed: 08/07/2011 [facility] Patient Information 2015 [place], [place]. This information is not intended to replace advice given to you by your health care provider. Make sure you discuss any questions you have with your health care provider.     I personally performed the services described in this documentation, which was scribed in my presence. The recorded information has been reviewed and considered, and addended by me as needed.

## 2015-02-10 ENCOUNTER — Ambulatory Visit (INDEPENDENT_AMBULATORY_CARE_PROVIDER_SITE_OTHER): Payer: BLUE CROSS/BLUE SHIELD | Admitting: Family Medicine

## 2015-02-10 ENCOUNTER — Ambulatory Visit (INDEPENDENT_AMBULATORY_CARE_PROVIDER_SITE_OTHER): Payer: BLUE CROSS/BLUE SHIELD

## 2015-02-10 VITALS — BP 116/68 | HR 99 | Temp 97.9°F | Resp 18 | Ht 67.0 in | Wt 135.0 lb

## 2015-02-10 DIAGNOSIS — R0789 Other chest pain: Secondary | ICD-10-CM

## 2015-02-10 DIAGNOSIS — R059 Cough, unspecified: Secondary | ICD-10-CM

## 2015-02-10 DIAGNOSIS — R0781 Pleurodynia: Secondary | ICD-10-CM

## 2015-02-10 DIAGNOSIS — R062 Wheezing: Secondary | ICD-10-CM

## 2015-02-10 DIAGNOSIS — J45909 Unspecified asthma, uncomplicated: Secondary | ICD-10-CM

## 2015-02-10 DIAGNOSIS — R05 Cough: Secondary | ICD-10-CM

## 2015-02-10 LAB — POCT CBC
GRANULOCYTE PERCENT: 45.1 % (ref 37–80)
HCT, POC: 43.4 % (ref 37.7–47.9)
Hemoglobin: 13.6 g/dL (ref 12.2–16.2)
Lymph, poc: 3.6 — AB (ref 0.6–3.4)
MCH, POC: 28.9 pg (ref 27–31.2)
MCHC: 31.3 g/dL — AB (ref 31.8–35.4)
MCV: 92.3 fL (ref 80–97)
MID (cbc): 0.4 (ref 0–0.9)
MPV: 7.7 fL (ref 0–99.8)
PLATELET COUNT, POC: 283 10*3/uL (ref 142–424)
POC GRANULOCYTE: 3.3 (ref 2–6.9)
POC LYMPH PERCENT: 48.9 %L (ref 10–50)
POC MID %: 6 % (ref 0–12)
RBC: 0.7 M/uL — AB (ref 4.04–5.48)
RDW, POC: 13.7 %
WBC: 7.4 10*3/uL (ref 4.6–10.2)

## 2015-02-10 LAB — D-DIMER, QUANTITATIVE: D-Dimer, Quant: 0.83 ug/mL-FEU — ABNORMAL HIGH (ref 0.00–0.48)

## 2015-02-10 LAB — POCT SEDIMENTATION RATE: POCT SED RATE: 46 mm/h — AB (ref 0–22)

## 2015-02-10 MED ORDER — PREDNISONE 20 MG PO TABS: 40.0000 mg | ORAL_TABLET | Freq: Every day | ORAL | Status: DC

## 2015-02-10 NOTE — Patient Instructions (Signed)
You should receive a call or letter about your lab results within the next day or two for inflammation and blood clot tests. Ok to continue tessalon for cough during day, hydrocodone if needed at night, start zantac twice per day in case some of this cough is due to heartburn. Stop ibuprofen while taking prednisone - just tylenol for now. If not improving in next week, let me know and can look into other evaluation or pulmonary doctor eval. Return to the clinic or go to the nearest emergency room if any of your symptoms worsen or new symptoms occur.  Cough, Adult  A cough is a reflex that helps clear your throat and airways. It can help heal the body or may be a reaction to an irritated airway. A cough may only last 2 or 3 weeks (acute) or may last more than 8 weeks (chronic).  CAUSES Acute cough:  Viral or bacterial infections. Chronic cough:  Infections.  Allergies.  Asthma.  Post-nasal drip.  Smoking.  Heartburn or acid reflux.  Some medicines.  Chronic lung problems (COPD).  Cancer. SYMPTOMS   Cough.  Fever.  Chest pain.  Increased breathing rate.  High-pitched whistling sound when breathing (wheezing).  Colored mucus that you cough up (sputum). TREATMENT   A bacterial cough may be treated with antibiotic medicine.  A viral cough must run its course and will not respond to antibiotics.  Your caregiver may recommend other treatments if you have a chronic cough. HOME CARE INSTRUCTIONS   Only take over-the-counter or prescription medicines for pain, discomfort, or fever as directed by your caregiver. Use cough suppressants only as directed by your caregiver.  Use a cold steam vaporizer or humidifier in your bedroom or home to help loosen secretions.  Sleep in a semi-upright position if your cough is worse at night.  Rest as needed.  Stop smoking if you smoke. SEEK IMMEDIATE MEDICAL CARE IF:   You have pus in your sputum.  Your cough starts to  worsen.  You cannot control your cough with suppressants and are losing sleep.  You begin coughing up blood.  You have difficulty breathing.  You develop pain which is getting worse or is uncontrolled with medicine.  You have a fever. MAKE SURE YOU:   Understand these instructions.  Will watch your condition.  Will get help right away if you are not doing well or get worse. Document Released: 06/09/2011 Document Revised: 03/04/2012 Document Reviewed: 06/09/2011 [facility] Patient Information 2015 [place], [place]. This information is not intended to replace advice given to you by your health care provider. Make sure you discuss any questions you have with your health care provider.  Chest Wall Pain Chest wall pain is pain in or around the bones and muscles of your chest. It may take up to 6 weeks to get better. It may take longer if you must stay physically active in your work and activities.  CAUSES  Chest wall pain may happen on its own. However, it may be caused by:  A viral illness like the flu.  Injury.  Coughing.  Exercise.  Arthritis.  Fibromyalgia.  Shingles. HOME CARE INSTRUCTIONS   Avoid overtiring physical activity. Try not to strain or perform activities that cause pain. This includes any activities using your chest or your abdominal and side muscles, especially if heavy weights are used.  Put ice on the sore area.  Put ice in a plastic bag.  Place a towel between your skin and the bag.  Leave the ice on for 15-20 minutes per hour while awake for the first 2 days.  Only take over-the-counter or prescription medicines for pain, discomfort, or fever as directed by your caregiver. SEEK IMMEDIATE MEDICAL CARE IF:   Your pain increases, or you are very uncomfortable.  You have a fever.  Your chest pain becomes worse.  You have new, unexplained symptoms.  You have nausea or vomiting.  You feel sweaty or lightheaded.  You have a cough with phlegm  (sputum), or you cough up blood. MAKE SURE YOU:   Understand these instructions.  Will watch your condition.  Will get help right away if you are not doing well or get worse. Document Released: 12/11/2005 Document Revised: 03/04/2012 Document Reviewed: 08/07/2011 [facility] Patient Information 2015 [place], [place]. This information is not intended to replace advice given to you by your health care provider. Make sure you discuss any questions you have with your health care provider.

## 2015-02-10 NOTE — Progress Notes (Addendum)
Subjective:  This chart was scribed for Raven Harper, by Tamsen Roers, at [facility]  This patient was seen in room 10 and the patient's care was started at 1:59 PM.   Patient ID: Raven Harper, female    DOB: 20-May-1976, 39 y.o.   MRN: [id number]  HPI  HPI Comments: Raven Harper is a 39 y.o. female who presents to [facility] for a persistent cough (clear mucous).  Patient notes she is taking all of her medication and is still coughing.  Patient is using her inhaler every 8 hours( 3 times a day) to help for her cough.  She notes that she has shortness of breath when she is talking and coughing and has soreness in her chest from the coughing. She has associated symtoms of emesis yesterday two times after an episode of coughing.  She denies wheezing, fevers or calf pain/tenderness. She notes that the cough suppressant is not helping her much at night.  Patient thinks that the prednisone may have helped her a bit.    She was seen Feb 9th after a cough for 2-3 weeks.  Right sided chest wall pain and tightness with cough positive sick contacts.  She had taken her husbands prednisone two nights prior.  Suspected bronchitis with chest wall pain from cough.  Chest x ray and EKG were declined last visit. She was treated with  Z Pack Tessalon Hycodan. And albuterol for weeks as well as Advil or Alleve for chest pain.       There are no active problems to display for this patient.  Past Medical History  Diagnosis Date  . H/O menorrhagia   . Hyperlipidemia     H/O  . Heart murmur     no problems  . Seasonal allergies    Past Surgical History  Procedure Laterality Date  . Wisdom tooth extraction    . Dilatation & currettage/hysteroscopy with resectocope Bilateral 06/05/2013    Procedure: DILATATION & CURETTAGE/HYSTEROSCOPY WITH RESECTOCOPE;  Surgeon: Alwyn Pea, Harper;  Location: [facility];  Service: Gynecology;  Laterality: Bilateral;    Allergies  Allergen Reactions  . Latex Other (See Comments)    Patient states that she is sensitive to latex.   Prior to Admission medications   Medication Sig Start Date End Date Taking? Authorizing Provider  albuterol (PROVENTIL HFA;VENTOLIN HFA) 108 (90 BASE) MCG/ACT inhaler Inhale 1-2 puffs into the lungs every 4 (four) hours as needed for wheezing or shortness of breath. 02/02/15  Yes Wendie Agreste, Harper  benzonatate (TESSALON) 100 MG capsule Take 1 capsule (100 mg total) by mouth 3 (three) times daily as needed for cough. 02/02/15  Yes Wendie Agreste, Harper  Biotin 5000 MCG TABS Take 1 tablet by mouth daily.   Yes Historical Provider, Harper  HYDROcodone-homatropine (HYCODAN) 5-1.5 MG/5ML syrup 33mby mouth a bedtime as needed for cough. 02/02/15  Yes JWendie Agreste Harper  Multiple Vitamin (MULTIVITAMIN) tablet Take 1 tablet by mouth daily.   Yes Historical Provider, Harper  azithromycin (ZITHROMAX) 250 MG tablet Take 2 pills by mouth on day 1, then 1 pill by mouth per day on days 2 through 5. Patient not taking: Reported on 02/10/2015 02/02/15   JWendie Agreste Harper   History   Social History  . Marital Status: Single    Spouse Name: N/A  . Number of Children: N/A  . Years of Education: N/A   Occupational History  . Not  on file.   Social History Main Topics  . Smoking status: Never Smoker   . Smokeless tobacco: Never Used  . Alcohol Use: 0.6 oz/week    1 Glasses of wine per week  . Drug Use: No  . Sexual Activity: Yes    Birth Control/ Protection: None   Other Topics Concern  . Not on file   Social History Narrative         Review of Systems  Constitutional: Negative for fever and chills.  Respiratory: Positive for cough. Negative for wheezing.   Cardiovascular: Positive for chest pain.  Gastrointestinal: Positive for vomiting.       Objective:   Physical Exam  Constitutional: She is oriented to person, place, and time. She appears well-developed and well-nourished.  No distress.  HENT:  Head: Normocephalic and atraumatic.  Right Ear: Hearing, tympanic membrane, external ear and ear canal normal.  Left Ear: Hearing, tympanic membrane, external ear and ear canal normal.  Nose: Nose normal.  Mouth/Throat: Oropharynx is clear and moist. No oropharyngeal exudate.  Eyes: Conjunctivae and EOM are normal. Pupils are equal, round, and reactive to light.  Neck: Carotid bruit is not present.  Cardiovascular: Normal rate, regular rhythm, normal heart sounds and intact distal pulses.   No murmur heard. Pulmonary/Chest: Effort normal and breath sounds normal. No respiratory distress. She has no wheezes. She has no rhonchi. She has no rales.  Some reproduction of chest wall pain with palpation and movement No reproduction of chest pain with leaning forward.    Abdominal: Soft. She exhibits no pulsatile midline mass. There is no tenderness.  Musculoskeletal:  Calves are non tender   Neurological: She is alert and oriented to person, place, and time.  Skin: Skin is warm and dry. No rash noted.  Psychiatric: She has a normal mood and affect. Her behavior is normal.  Vitals reviewed.  Filed Vitals:   02/10/15 1321  BP: 116/68  Pulse: 99  Temp: 97.9 F (36.6 C)  TempSrc: Oral  Resp: 18  Height: 5' 7"  (1.702 m)  Weight: 135 lb (61.236 kg)  SpO2: 99%   Results for orders placed or performed in visit on 02/10/15  POCT CBC  Result Value Ref Range   WBC 7.4 4.6 - 10.2 K/uL   Lymph, poc 3.6 (A) 0.6 - 3.4   POC LYMPH PERCENT 48.9 10 - 50 %L   MID (cbc) 0.4 0 - 0.9   POC MID % 6.0 0 - 12 %M   POC Granulocyte 3.3 2 - 6.9   Granulocyte percent 45.1 37 - 80 %G   RBC 0.70 (A) 4.04 - 5.48 M/uL   Hemoglobin 13.6 12.2 - 16.2 g/dL   HCT, POC 43.4 37.7 - 47.9 %   MCV 92.3 80 - 97 fL   MCH, POC 28.9 27 - 31.2 pg   MCHC 31.3 (A) 31.8 - 35.4 g/dL   RDW, POC 13.7 %   Platelet Count, POC 283 142 - 424 K/uL   MPV 7.7 0 - 99.8 fL    [facility] reading (PRIMARY) by  Dr.  Carlota Raspberry: CXR: few increased perihilar markings, no discrete infiltrate.   EKG: sr, no acute findings.      Assessment & Plan:   Raven Harper is a 39 y.o. female Chest wall pain - Plan: POCT CBC, DG Chest 2 View, D-dimer, quantitative, EKG 12-Lead, POCT SEDIMENTATION RATE  Pleuritic chest pain - Plan: POCT CBC, DG Chest 2 View, D-dimer, quantitative, EKG 12-Lead  Wheezing -  Plan: POCT CBC, DG Chest 2 View, D-dimer, quantitative, EKG 12-Lead, predniSONE (DELTASONE) 20 MG tablet  Cough - Plan: POCT CBC, DG Chest 2 View, D-dimer, quantitative, EKG 12-Lead, predniSONE (DELTASONE) 20 MG tablet  Reactive airway disease, unspecified asthma severity, uncomplicated  persistent cough and episodic wheeze with persistent albuterol use. Possible reactive airway.  S/p Zpak 8 days ago - still treating.   -Start prednisone 16m qd for 5 days - SED, RTC precautions. Reported normal blood sugar at physical in past year.   -ok to cont albuterol, tessalon, and hycodan if NOT wheeze/dyspneic at night.   -chest wall pain/pleuritic likely form persistent cough.  Will check ESR, bu no sign of pericarditis on EKG. No known DVT/PE risk factors, so will check D-Dimer, but apparent low risk of PE. Reassuring exam with reproducible pain along chest wall and intercostals. Hold nsaid while on prednisone, tylenol ok for now.   12:14 AM Addendum: ESR and Ddimer elevated. Will schedule CT chest.  See lab notes.   Meds ordered this encounter  Medications  . predniSONE (DELTASONE) 20 MG tablet    Sig: Take 2 tablets (40 mg total) by mouth daily with breakfast.    Dispense:  10 tablet    Refill:  0   Patient Instructions  You should receive a call or letter about your lab results within the next day or two for inflammation and blood clot tests. Ok to continue tessalon for cough during day, hydrocodone if needed at night, start zantac twice per day in case some of this cough is due to heartburn. Stop ibuprofen while  taking prednisone - just tylenol for now. If not improving in next week, let me know and can look into other evaluation or pulmonary doctor eval. Return to the clinic or go to the nearest emergency room if any of your symptoms worsen or new symptoms occur.  Cough, Adult  A cough is a reflex that helps clear your throat and airways. It can help heal the body or may be a reaction to an irritated airway. A cough may only last 2 or 3 weeks (acute) or may last more than 8 weeks (chronic).  CAUSES Acute cough:  Viral or bacterial infections. Chronic cough:  Infections.  Allergies.  Asthma.  Post-nasal drip.  Smoking.  Heartburn or acid reflux.  Some medicines.  Chronic lung problems (COPD).  Cancer. SYMPTOMS   Cough.  Fever.  Chest pain.  Increased breathing rate.  High-pitched whistling sound when breathing (wheezing).  Colored mucus that you cough up (sputum). TREATMENT   A bacterial cough may be treated with antibiotic medicine.  A viral cough must run its course and will not respond to antibiotics.  Your caregiver may recommend other treatments if you have a chronic cough. HOME CARE INSTRUCTIONS   Only take over-the-counter or prescription medicines for pain, discomfort, or fever as directed by your caregiver. Use cough suppressants only as directed by your caregiver.  Use a cold steam vaporizer or humidifier in your bedroom or home to help loosen secretions.  Sleep in a semi-upright position if your cough is worse at night.  Rest as needed.  Stop smoking if you smoke. SEEK IMMEDIATE MEDICAL CARE IF:   You have pus in your sputum.  Your cough starts to worsen.  You cannot control your cough with suppressants and are losing sleep.  You begin coughing up blood.  You have difficulty breathing.  You develop pain which is getting worse or is uncontrolled with medicine.  You have a fever. MAKE SURE YOU:   Understand these instructions.  Will watch  your condition.  Will get help right away if you are not doing well or get worse. Document Released: 06/09/2011 Document Revised: 03/04/2012 Document Reviewed: 06/09/2011 [facility] Patient Information 2015 [place], [place]. This information is not intended to replace advice given to you by your health care provider. Make sure you discuss any questions you have with your health care provider.  Chest Wall Pain Chest wall pain is pain in or around the bones and muscles of your chest. It may take up to 6 weeks to get better. It may take longer if you must stay physically active in your work and activities.  CAUSES  Chest wall pain may happen on its own. However, it may be caused by:  A viral illness like the flu.  Injury.  Coughing.  Exercise.  Arthritis.  Fibromyalgia.  Shingles. HOME CARE INSTRUCTIONS   Avoid overtiring physical activity. Try not to strain or perform activities that cause pain. This includes any activities using your chest or your abdominal and side muscles, especially if heavy weights are used.  Put ice on the sore area.  Put ice in a plastic bag.  Place a towel between your skin and the bag.  Leave the ice on for 15-20 minutes per hour while awake for the first 2 days.  Only take over-the-counter or prescription medicines for pain, discomfort, or fever as directed by your caregiver. SEEK IMMEDIATE MEDICAL CARE IF:   Your pain increases, or you are very uncomfortable.  You have a fever.  Your chest pain becomes worse.  You have new, unexplained symptoms.  You have nausea or vomiting.  You feel sweaty or lightheaded.  You have a cough with phlegm (sputum), or you cough up blood. MAKE SURE YOU:   Understand these instructions.  Will watch your condition.  Will get help right away if you are not doing well or get worse. Document Released: 12/11/2005 Document Revised: 03/04/2012 Document Reviewed: 08/07/2011 [facility] Patient Information 2015  [place], [place]. This information is not intended to replace advice given to you by your health care provider. Make sure you discuss any questions you have with your health care provider.

## 2015-02-11 ENCOUNTER — Encounter (HOSPITAL_COMMUNITY): Payer: Self-pay

## 2015-02-11 ENCOUNTER — Encounter: Payer: Self-pay | Admitting: Family Medicine

## 2015-02-11 ENCOUNTER — Ambulatory Visit (HOSPITAL_COMMUNITY)
Admission: RE | Admit: 2015-02-11 | Discharge: 2015-02-11 | Disposition: A | Discharge status: Home / Self Care | Payer: BLUE CROSS/BLUE SHIELD | Source: Ambulatory Visit | Attending: Family Medicine | Admitting: Family Medicine

## 2015-02-11 DIAGNOSIS — R05 Cough: Secondary | ICD-10-CM | POA: Diagnosis not present

## 2015-02-11 DIAGNOSIS — R0789 Other chest pain: Secondary | ICD-10-CM | POA: Insufficient documentation

## 2015-02-11 DIAGNOSIS — R0781 Pleurodynia: Secondary | ICD-10-CM

## 2015-02-11 DIAGNOSIS — R059 Cough, unspecified: Secondary | ICD-10-CM

## 2015-02-11 MED ORDER — IOHEXOL 350 MG/ML SOLN
100.0000 mL | Freq: Once | INTRAVENOUS | Status: AC | PRN
  Administered 2015-02-11: 100 mL via INTRAVENOUS

## 2015-02-11 NOTE — Addendum Note (Signed)
Addended by: Merri Ray R on: 02/11/2015 12:17 AM   Modules accepted: Orders

## 2015-02-16 ENCOUNTER — Telehealth: Payer: Self-pay

## 2015-02-16 ENCOUNTER — Telehealth: Payer: Self-pay | Admitting: Family Medicine

## 2015-02-16 DIAGNOSIS — R059 Cough, unspecified: Secondary | ICD-10-CM

## 2015-02-16 DIAGNOSIS — R05 Cough: Secondary | ICD-10-CM

## 2015-02-16 NOTE — Telephone Encounter (Signed)
Pt requesting a refill on her HYCODAN 5-1.5 cough medicine. Please call [phone] when ready for pick up

## 2015-02-21 MED ORDER — HYDROCODONE-HOMATROPINE 5-1.5 MG/5ML PO SYRP: ORAL_SOLUTION | ORAL | Status: DC

## 2015-02-21 NOTE — Telephone Encounter (Signed)
Pt was called and advised that her Hycodan cough syrup was ready for pick up.  However, pt applied that she also needed an refill on the Tessalon pearls.  I advised pt I would send in an request note and someone would get back with her.

## 2015-02-21 NOTE — Telephone Encounter (Signed)
Rx ready.

## 2015-02-22 MED ORDER — BENZONATATE 100 MG PO CAPS: 100.0000 mg | ORAL_CAPSULE | Freq: Three times a day (TID) | ORAL | Status: DC | PRN

## 2015-02-22 NOTE — Addendum Note (Signed)
Addended by: Fara Chute on: 02/22/2015 09:37 AM   Modules accepted: Orders, Medications

## 2015-02-22 NOTE — Telephone Encounter (Signed)
Meds ordered this encounter  Medications  . benzonatate (TESSALON) 100 MG capsule    Sig: Take 1 capsule (100 mg total) by mouth 3 (three) times daily as needed for cough.    Dispense:  20 capsule    Refill:  0    Order Specific Question:  Supervising Provider    Answer:  DOOLITTLE, ROBERT P [[id number]]

## 2015-02-22 NOTE — Telephone Encounter (Signed)
Left a message on her vm letting her know.

## 2015-12-14 ENCOUNTER — Other Ambulatory Visit: Payer: Self-pay

## 2015-12-14 DIAGNOSIS — Z1231 Encounter for screening mammogram for malignant neoplasm of breast: Secondary | ICD-10-CM

## 2016-01-10 ENCOUNTER — Ambulatory Visit
Admission: RE | Admit: 2016-01-10 | Discharge: 2016-01-10 | Disposition: A | Discharge status: Home / Self Care | Payer: BLUE CROSS/BLUE SHIELD | Source: Ambulatory Visit

## 2016-01-10 DIAGNOSIS — Z1231 Encounter for screening mammogram for malignant neoplasm of breast: Secondary | ICD-10-CM

## 2016-12-25 NOTE — L&D Delivery Note (Signed)
Delivery Note At  a viable female was delivered via  (Presentation:OA ;  ).  APGAR:9 ,9 ; weight pending  .   Placenta status:complete , . 3V  Cord:  with the following complications: None.    Anesthesia:  Epidural Episiotomy:  None Lacerations:  2nd vaginal Suture Repair: 2.0 vicryl Est. Blood Loss (mL):  300cc  Mom to postpartum.  Baby to Couplet care / Skin to Skin.  [name] 12/16/2017, 3:25 PM

## 2017-05-15 LAB — OB RESULTS CONSOLE ABO/RH: RH TYPE: POSITIVE

## 2017-05-15 LAB — OB RESULTS CONSOLE GC/CHLAMYDIA
Chlamydia: NEGATIVE
Gonorrhea: NEGATIVE

## 2017-05-15 LAB — OB RESULTS CONSOLE HEPATITIS B SURFACE ANTIGEN: HEP B S AG: NEGATIVE

## 2017-05-15 LAB — OB RESULTS CONSOLE HIV ANTIBODY (ROUTINE TESTING): HIV: NONREACTIVE

## 2017-05-15 LAB — OB RESULTS CONSOLE RUBELLA ANTIBODY, IGM: Rubella: UNDETERMINED

## 2017-05-15 LAB — OB RESULTS CONSOLE RPR: RPR: NONREACTIVE

## 2017-05-15 LAB — OB RESULTS CONSOLE ANTIBODY SCREEN: Antibody Screen: NEGATIVE

## 2017-10-30 LAB — OB RESULTS CONSOLE GBS: GBS: NEGATIVE

## 2017-12-05 ENCOUNTER — Telehealth (HOSPITAL_COMMUNITY): Payer: Self-pay | Admitting: *Deleted

## 2017-12-05 ENCOUNTER — Encounter (HOSPITAL_COMMUNITY): Payer: Self-pay | Admitting: *Deleted

## 2017-12-05 NOTE — Telephone Encounter (Signed)
Preadmission screen  

## 2017-12-10 ENCOUNTER — Other Ambulatory Visit: Payer: Self-pay | Admitting: Obstetrics and Gynecology

## 2017-12-15 ENCOUNTER — Inpatient Hospital Stay (HOSPITAL_COMMUNITY)
Admission: RE | Admit: 2017-12-15 | Discharge: 2017-12-18 | DRG: 807 | Disposition: A | Discharge status: Home / Self Care | Payer: BLUE CROSS/BLUE SHIELD | Source: Ambulatory Visit | Attending: Obstetrics and Gynecology | Admitting: Obstetrics and Gynecology

## 2017-12-15 ENCOUNTER — Inpatient Hospital Stay (HOSPITAL_COMMUNITY): Payer: BLUE CROSS/BLUE SHIELD | Admitting: Anesthesiology

## 2017-12-15 ENCOUNTER — Encounter (HOSPITAL_COMMUNITY): Payer: Self-pay

## 2017-12-15 DIAGNOSIS — Z3A39 39 weeks gestation of pregnancy: Secondary | ICD-10-CM

## 2017-12-15 DIAGNOSIS — O26893 Other specified pregnancy related conditions, third trimester: Principal | ICD-10-CM | POA: Diagnosis present

## 2017-12-15 DIAGNOSIS — O09513 Supervision of elderly primigravida, third trimester: Secondary | ICD-10-CM | POA: Diagnosis present

## 2017-12-15 LAB — CBC
HCT: 27.7 % — ABNORMAL LOW (ref 36.0–46.0)
Hemoglobin: 9.3 g/dL — ABNORMAL LOW (ref 12.0–15.0)
MCH: 30.7 pg (ref 26.0–34.0)
MCHC: 33.6 g/dL (ref 30.0–36.0)
MCV: 91.4 fL (ref 78.0–100.0)
PLATELETS: 231 10*3/uL (ref 150–400)
RBC: 3.03 MIL/uL — AB (ref 3.87–5.11)
RDW: 14.6 % (ref 11.5–15.5)
WBC: 9.4 10*3/uL (ref 4.0–10.5)

## 2017-12-15 LAB — RPR: RPR: NONREACTIVE

## 2017-12-15 LAB — TYPE AND SCREEN
ABO/RH(D): A POS
ANTIBODY SCREEN: NEGATIVE

## 2017-12-15 LAB — ABO/RH: ABO/RH(D): A POS

## 2017-12-15 MED ORDER — FENTANYL 2.5 MCG/ML BUPIVACAINE 1/10 % EPIDURAL INFUSION (WH - ANES)
INTRAMUSCULAR | Status: AC
  Filled 2017-12-15: qty 100

## 2017-12-15 MED ORDER — LACTATED RINGERS IV BOLUS (SEPSIS)
500.0000 mL | Freq: Once | INTRAVENOUS | Status: AC
  Administered 2017-12-15: 500 mL via INTRAVENOUS

## 2017-12-15 MED ORDER — LIDOCAINE HCL (PF) 1 % IJ SOLN
30.0000 mL | INTRAMUSCULAR | Status: AC | PRN
  Administered 2017-12-16: 30 mL via SUBCUTANEOUS
  Filled 2017-12-15: qty 30

## 2017-12-15 MED ORDER — LACTATED RINGERS IV SOLN
500.0000 mL | Freq: Once | INTRAVENOUS | Status: AC
  Administered 2017-12-15: 500 mL via INTRAVENOUS

## 2017-12-15 MED ORDER — ONDANSETRON HCL 4 MG/2ML IJ SOLN: 4.0000 mg | Freq: Four times a day (QID) | INTRAMUSCULAR | Status: DC | PRN

## 2017-12-15 MED ORDER — MISOPROSTOL 25 MCG QUARTER TABLET
25.0000 ug | ORAL_TABLET | ORAL | Status: DC | PRN
  Administered 2017-12-15 (×2): 25 ug via VAGINAL
  Filled 2017-12-15 (×3): qty 1

## 2017-12-15 MED ORDER — EPHEDRINE 5 MG/ML INJ
10.0000 mg | INTRAVENOUS | Status: DC | PRN
  Filled 2017-12-15: qty 2

## 2017-12-15 MED ORDER — PHENYLEPHRINE 40 MCG/ML (10ML) SYRINGE FOR IV PUSH (FOR BLOOD PRESSURE SUPPORT)
80.0000 ug | PREFILLED_SYRINGE | INTRAVENOUS | Status: DC | PRN
  Filled 2017-12-15: qty 5

## 2017-12-15 MED ORDER — FENTANYL CITRATE (PF) 100 MCG/2ML IJ SOLN
50.0000 ug | INTRAMUSCULAR | Status: DC | PRN
  Administered 2017-12-15 (×2): 100 ug via INTRAVENOUS
  Administered 2017-12-15: 50 ug via INTRAVENOUS
  Administered 2017-12-15 (×2): 100 ug via INTRAVENOUS
  Filled 2017-12-15 (×5): qty 2

## 2017-12-15 MED ORDER — OXYTOCIN 40 UNITS IN LACTATED RINGERS INFUSION - SIMPLE MED: 2.5000 [IU]/h | INTRAVENOUS | Status: DC

## 2017-12-15 MED ORDER — LACTATED RINGERS IV SOLN
INTRAVENOUS | Status: DC
  Administered 2017-12-15 – 2017-12-16 (×5): via INTRAVENOUS

## 2017-12-15 MED ORDER — FENTANYL 2.5 MCG/ML BUPIVACAINE 1/10 % EPIDURAL INFUSION (WH - ANES)
14.0000 mL/h | INTRAMUSCULAR | Status: DC | PRN
  Administered 2017-12-15 – 2017-12-16 (×3): 14 mL/h via EPIDURAL
  Filled 2017-12-15 (×2): qty 100

## 2017-12-15 MED ORDER — ACETAMINOPHEN 325 MG PO TABS
650.0000 mg | ORAL_TABLET | ORAL | Status: DC | PRN
  Administered 2017-12-16: 650 mg via ORAL
  Filled 2017-12-15: qty 2

## 2017-12-15 MED ORDER — LIDOCAINE HCL (PF) 1 % IJ SOLN
INTRAMUSCULAR | Status: DC | PRN
  Administered 2017-12-15 (×2): 5 mL

## 2017-12-15 MED ORDER — FLEET ENEMA 7-19 GM/118ML RE ENEM: 1.0000 | ENEMA | RECTAL | Status: DC | PRN

## 2017-12-15 MED ORDER — SOD CITRATE-CITRIC ACID 500-334 MG/5ML PO SOLN: 30.0000 mL | ORAL | Status: DC | PRN

## 2017-12-15 MED ORDER — LACTATED RINGERS IV SOLN
500.0000 mL | INTRAVENOUS | Status: DC | PRN
  Administered 2017-12-16: 300 mL via INTRAVENOUS
  Administered 2017-12-16: 500 mL via INTRAVENOUS

## 2017-12-15 MED ORDER — PHENYLEPHRINE 40 MCG/ML (10ML) SYRINGE FOR IV PUSH (FOR BLOOD PRESSURE SUPPORT)
PREFILLED_SYRINGE | INTRAVENOUS | Status: AC
  Filled 2017-12-15: qty 20

## 2017-12-15 MED ORDER — TERBUTALINE SULFATE 1 MG/ML IJ SOLN
0.2500 mg | Freq: Once | INTRAMUSCULAR | Status: DC | PRN
  Filled 2017-12-15: qty 1

## 2017-12-15 MED ORDER — OXYTOCIN 40 UNITS IN LACTATED RINGERS INFUSION - SIMPLE MED
1.0000 m[IU]/min | INTRAVENOUS | Status: DC
  Administered 2017-12-15: 1 m[IU]/min via INTRAVENOUS
  Filled 2017-12-15: qty 1000

## 2017-12-15 MED ORDER — OXYTOCIN BOLUS FROM INFUSION
500.0000 mL | Freq: Once | INTRAVENOUS | Status: AC
  Administered 2017-12-16: 500 mL via INTRAVENOUS

## 2017-12-15 MED ORDER — DIPHENHYDRAMINE HCL 50 MG/ML IJ SOLN: 12.5000 mg | INTRAMUSCULAR | Status: DC | PRN

## 2017-12-15 NOTE — Progress Notes (Signed)
Subjective: Pt using Fentanyl for pain relief.  Eating clear liquid tray.  Foley still in place.  Objective: BP (!) 112/59   Pulse (!) 101   Temp 98.4 F (36.9 C) (Oral)   Resp 16   Ht 5\' 7"  (1.702 m)   Wt 82.6 kg (182 lb)   LMP 03/22/2017   SpO2 99%   BMI 28.51 kg/m  No intake/output data recorded. Total I/O In: 895.8 [I.V.:895.8] Out: -   FHT: Category 1 UC:   regular, every 3 minutes SVE:   Dilation: 1 Effacement (%): 60 Station: -2 Exam by:: Irene Shipper, CNM Assessment:  41yo G1P0 at 39 weeks induction for AMA Cat 1 strip  Plan: Pitocin augmentation if needed.   Starla Link CNM, MSN 12/15/2017, 5:25 PM

## 2017-12-15 NOTE — Progress Notes (Signed)
Pt states that she does not have a latex allergy. When inquiring why it was indicated in her chart that she had an allergy, she states that 20+ years ago she had vaginal irritation after intercourse with a latex condom. She has since had intercourse with latex condoms with no irritation and wants the "latex allergy" removed from her chart.

## 2017-12-15 NOTE — Anesthesia Procedure Notes (Signed)
Epidural Patient location during procedure: OB  Staffing Anesthesiologist: Lurena Naeve, MD Performed: anesthesiologist   Preanesthetic Checklist Completed: patient identified, site marked, surgical consent, pre-op evaluation, timeout performed, IV checked, risks and benefits discussed and monitors and equipment checked  Epidural Patient position: sitting Prep: DuraPrep Patient monitoring: heart rate, continuous pulse ox and blood pressure Approach: right paramedian Location: L3-L4 Injection technique: LOR saline  Needle:  Needle type: Tuohy  Needle gauge: 17 G Needle length: 9 cm and 9 Needle insertion depth: 5 cm Catheter type: closed end flexible Catheter size: 20 Guage Catheter at skin depth: 10 cm Test dose: negative  Assessment Events: blood not aspirated, injection not painful, no injection resistance, negative IV test and no paresthesia  Additional Notes Patient identified. Risks/Benefits/Options discussed with patient including but not limited to bleeding, infection, nerve damage, paralysis, failed block, incomplete pain control, headache, blood pressure changes, nausea, vomiting, reactions to medication both or allergic, itching and postpartum back pain. Confirmed with bedside nurse the patient's most recent platelet count. Confirmed with patient that they are not currently taking any anticoagulation, have any bleeding history or any family history of bleeding disorders. Patient expressed understanding and wished to proceed. All questions were answered. Sterile technique was used throughout the entire procedure. Please see nursing notes for vital signs. Test dose was given through epidural needle and negative prior to continuing to dose epidural or start infusion. Warning signs of high block given to the patient including shortness of breath, tingling/numbness in hands, complete motor block, or any concerning symptoms with instructions to call for help. Patient was given  instructions on fall risk and not to get out of bed. All questions and concerns addressed with instructions to call with any issues.     

## 2017-12-15 NOTE — Anesthesia Preprocedure Evaluation (Signed)

## 2017-12-15 NOTE — Progress Notes (Signed)
Subjective: Pt breathing with contractions.  Has had one dose of Fentanyl  Objective: BP 107/66   Pulse 86   Temp 98.1 F (36.7 C) (Oral)   Resp 16   Ht 5\' 7"  (1.702 m)   Wt 82.6 kg (182 lb)   LMP 03/22/2017   SpO2 99%   BMI 28.51 kg/m  No intake/output data recorded. No intake/output data recorded.  FHT: Category 1 UC:   regular, every 3 with coupling minutes SVE:   Dilation: 1 Effacement (%): 60 Station: -2 Exam by:: Irene Shipper, CNM Foley bulb discussed with pt with risk and benefits.  Pt verbalized understanding.  Foley bulb inserted using speculum.  Pt tolerated procedure well.  600cc of saline inserted.  Assessment:  41yo G1P0 at 39 weeks induction for AMA Cat 1 strip  Plan: Pitocin augmentation when needed. Epidural for pain relief.  Starla Link CNM, MSN 12/15/2017, 11:38 AM

## 2017-12-15 NOTE — Progress Notes (Signed)
Patient ID: Raven Harper, female   DOB: 29-Aug-1976, 41 y.o.   MRN: [id number] S: Pt comfortable with epidural, very tired, wants to rest O: VSS      FHT's Cat 1      Pitocin at 47mu/min      SVE- 4cm (stretchy)/80/-2 A: Active Labor P : Pitocin induction      Anticipate vaginal Delivery

## 2017-12-15 NOTE — Anesthesia Pain Management Evaluation Note (Signed)
  CRNA Pain Management Visit Note  Patient: Raven Harper, 41 y.o., female  "Hello I am a member of the anesthesia team at [facility]. We have an anesthesia team available at all times to provide care throughout the hospital, including epidural management and anesthesia for C-section. I don't know your plan for the delivery whether it a natural birth, water birth, IV sedation, nitrous supplementation, doula or epidural, but we want to meet your pain goals."   1.Was your pain managed to your expectations on prior hospitalizations?   No prior hospitalizations  2.What is your expectation for pain management during this hospitalization?     Epidural  3.How can we help you reach that goal?   Record the patient's initial score and the patient's pain goal.   Pain: 4  Pain Goal: 7 [facility] wants you to be able to say your pain was always managed very well.  Jabier Mutton 12/15/2017

## 2017-12-15 NOTE — H&P (Signed)
Raven Harper is a G58P0 , 41 y.o. female @ [redacted]w[redacted]d presenting for Induction of labor due to [place]; pregnancy notable for maternal hyperlipidemia, GBS negative, varicella and Rubella non immune. OB History    Gravida Para Term Preterm AB Living   1             SAB TAB Ectopic Multiple Live Births                 Past Medical History:  Diagnosis Date  . H/O menorrhagia   . Heart murmur    no problems  . Hyperlipidemia    H/O  . Seasonal allergies    Past Surgical History:  Procedure Laterality Date  . DILATATION & CURRETTAGE/HYSTEROSCOPY WITH RESECTOCOPE Bilateral 06/05/2013   Procedure: DILATATION & CURETTAGE/HYSTEROSCOPY WITH RESECTOCOPE;  Surgeon: Alwyn Pea, MD;  Location: [facility];  Service: Gynecology;  Laterality: Bilateral;  . WISDOM TOOTH EXTRACTION     Family History: family history includes Breast cancer in her paternal grandmother; Cancer in her father and maternal grandmother; Hyperlipidemia in her mother; Hypertension in her mother; Lung cancer in her maternal grandmother. Social History:  reports that  has never smoked. she has never used smokeless tobacco. She reports that she drinks about 0.6 oz of alcohol per week. She reports that she does not use drugs.     Maternal Diabetes: No Genetic Screening: Normal Maternal Ultrasounds/Referrals: Normal Fetal Ultrasounds or other Referrals:  None Maternal Substance Abuse:  No Significant Maternal Medications:  None Significant Maternal Lab Results:  Lab values include: Group B Strep negative Other Comments:  None  ROS Maternal Medical History:  Reason for admission: Induction of labor  Fetal activity: Perceived fetal activity is normal.    Prenatal complications: AMA  Prenatal Complications - Diabetes: none.      Last menstrual period 03/22/2017. Maternal Exam:  Abdomen: Patient reports no abdominal tenderness. Fetal presentation: vertex  Introitus: Normal vulva. Normal vagina.    Fetal Exam Fetal  Monitor Review: Mode: ultrasound.   Baseline rate: 150.  Variability: moderate (6-25 bpm).   Pattern: accelerations present and no decelerations.    Fetal State Assessment: Category I - tracings are normal.     Physical Exam  Nursing note and vitals reviewed. Constitutional: She is oriented to person, place, and time. She appears well-developed and well-nourished. No distress.  HENT:  Head: Normocephalic.  Cardiovascular: Normal rate and regular rhythm.  Respiratory: Effort normal and breath sounds normal. No respiratory distress.  GI: Soft. There is no tenderness.  Genitourinary: Vagina normal.  Musculoskeletal: Normal range of motion.  Neurological: She is alert and oriented to person, place, and time.  Skin: Skin is warm and dry.  Psychiatric: She has a normal mood and affect.    Prenatal labs: ABO, Rh: A/Positive/-- (05/22 0000) Antibody: Negative (05/22 0000) Rubella: Equivocal (05/22 0000) RPR: Nonreactive (05/22 0000)  HBsAg: Negative (05/22 0000)  HIV: Non-reactive (05/22 0000)  GBS: Negative (11/06 0000)   Assessment/Plan: Admit to L/D Cytotec/Pitocin Induction Epidural for pain management Anticipate Vaginal Delivery  Raven Harper A Brynda Heick CNM 12/15/2017, 12:20 AM

## 2017-12-16 ENCOUNTER — Other Ambulatory Visit: Payer: Self-pay

## 2017-12-16 ENCOUNTER — Encounter (HOSPITAL_COMMUNITY): Payer: Self-pay

## 2017-12-16 MED ORDER — SIMETHICONE 80 MG PO CHEW: 80.0000 mg | CHEWABLE_TABLET | ORAL | Status: DC | PRN

## 2017-12-16 MED ORDER — DIPHENHYDRAMINE HCL 25 MG PO CAPS: 25.0000 mg | ORAL_CAPSULE | Freq: Four times a day (QID) | ORAL | Status: DC | PRN

## 2017-12-16 MED ORDER — ONDANSETRON HCL 4 MG PO TABS: 4.0000 mg | ORAL_TABLET | ORAL | Status: DC | PRN

## 2017-12-16 MED ORDER — PANTOPRAZOLE SODIUM 40 MG PO TBEC
40.0000 mg | DELAYED_RELEASE_TABLET | Freq: Every day | ORAL | Status: DC
  Administered 2017-12-16 – 2017-12-17 (×2): 40 mg via ORAL
  Filled 2017-12-16 (×2): qty 1

## 2017-12-16 MED ORDER — COCONUT OIL OIL: 1.0000 "application " | TOPICAL_OIL | Status: DC | PRN

## 2017-12-16 MED ORDER — ZOLPIDEM TARTRATE 5 MG PO TABS: 5.0000 mg | ORAL_TABLET | Freq: Every evening | ORAL | Status: DC | PRN

## 2017-12-16 MED ORDER — SENNOSIDES-DOCUSATE SODIUM 8.6-50 MG PO TABS
2.0000 | ORAL_TABLET | ORAL | Status: DC
  Administered 2017-12-17 (×2): 2 via ORAL
  Filled 2017-12-16 (×2): qty 2

## 2017-12-16 MED ORDER — TETANUS-DIPHTH-ACELL PERTUSSIS 5-2.5-18.5 LF-MCG/0.5 IM SUSP: 0.5000 mL | Freq: Once | INTRAMUSCULAR | Status: DC

## 2017-12-16 MED ORDER — MISOPROSTOL 200 MCG PO TABS
ORAL_TABLET | ORAL | Status: AC
  Filled 2017-12-16: qty 5

## 2017-12-16 MED ORDER — WITCH HAZEL-GLYCERIN EX PADS: 1.0000 "application " | MEDICATED_PAD | CUTANEOUS | Status: DC | PRN

## 2017-12-16 MED ORDER — OXYTOCIN 40 UNITS IN LACTATED RINGERS INFUSION - SIMPLE MED: 1.0000 m[IU]/min | INTRAVENOUS | Status: DC

## 2017-12-16 MED ORDER — DIBUCAINE 1 % RE OINT: 1.0000 "application " | TOPICAL_OINTMENT | RECTAL | Status: DC | PRN

## 2017-12-16 MED ORDER — BENZOCAINE-MENTHOL 20-0.5 % EX AERO
1.0000 "application " | INHALATION_SPRAY | CUTANEOUS | Status: DC | PRN
  Administered 2017-12-16 – 2017-12-18 (×2): 1 via TOPICAL
  Filled 2017-12-16 (×2): qty 56

## 2017-12-16 MED ORDER — PRENATAL MULTIVITAMIN CH
1.0000 | ORAL_TABLET | Freq: Every day | ORAL | Status: DC
  Administered 2017-12-17 – 2017-12-18 (×2): 1 via ORAL
  Filled 2017-12-16 (×2): qty 1

## 2017-12-16 MED ORDER — MEASLES, MUMPS & RUBELLA VAC ~~LOC~~ INJ: 0.5000 mL | INJECTION | Freq: Once | SUBCUTANEOUS | Status: DC

## 2017-12-16 MED ORDER — ONDANSETRON HCL 4 MG/2ML IJ SOLN: 4.0000 mg | INTRAMUSCULAR | Status: DC | PRN

## 2017-12-16 MED ORDER — IBUPROFEN 600 MG PO TABS
600.0000 mg | ORAL_TABLET | Freq: Four times a day (QID) | ORAL | Status: DC
  Administered 2017-12-16 – 2017-12-18 (×8): 600 mg via ORAL
  Filled 2017-12-16 (×8): qty 1

## 2017-12-16 MED ORDER — ACETAMINOPHEN 325 MG PO TABS
650.0000 mg | ORAL_TABLET | ORAL | Status: DC | PRN
  Administered 2017-12-16: 650 mg via ORAL
  Filled 2017-12-16: qty 2

## 2017-12-16 NOTE — Plan of Care (Signed)
Progressing appropriately. Encouraged mom to call for assistance to bathroom as needed. Encouraged mom to call for assistance with feeding and LATCH assessment. Would like to see Lactation when available.

## 2017-12-16 NOTE — Progress Notes (Signed)
Patient ID: Raven Harper, female   DOB: 1976-05-22, 41 y.o.   MRN: [id number] S: Pt has slept on and off during the night. RN reports checking her approx @ 330am and not noting any cervical change then. Pt had requested that her water not be broken because she felt like she was rushing the baby. She is comfortable. AROM with fetal head applied to the cervix during contraction; large amount copious clear fluid after AROM. IUPC placed without difficulty. O: Afebrile, VSS      FHT's 150's Cat 1      toco- contractions q 2-3      Pitocin- 14 mu/min      Cervix unchanged ; stretchy to 6cm but after AROM internal os more like 4cm A: first stage labor     Induction for AMA     Inadequate contractions P: increase pitocin 2 by 2     Titrate for adequate contractions     Anticipate vaginal delivery

## 2017-12-16 NOTE — Progress Notes (Signed)
Subjective: Pt not feeling pressure.  Resting with epidural.  SVE pt complete  Objective: BP 129/75   Pulse (!) 135   Temp 99.6 F (37.6 C) (Oral)   Resp 16   Ht 5\' 7"  (1.702 m)   Wt 82.6 kg (182 lb)   LMP 03/22/2017   SpO2 100%   BMI 28.51 kg/m  I/O last 3 completed shifts: In: 895.8 [I.V.:895.8] Out: 800 [Urine:800] Total I/O In: 3086.7 [I.V.:3086.7] Out: 900 [Urine:900]  FHT: Category 1  FHTS 160 variability present  accels noted  No decels noted UC:   regular, every 3 minutes SVE:   Dilation: 10 Effacement (%): 100 Station: +2 Exam by:: Latrelle Dodrill, CNM Pitocin at 21mu MVUs adequate Assessment:  41yo G1P0 at 39 weeks induction for AMA Cat 1 strip Plan:  Temp increased to 99.6  Will give 650mg  of tylenol.  Pushed x 50 minutes.  Fetus direct OP.  Pt getting tired.  Will let pt labor down until feels pressure.  Will monitor temp. Starla Link CNM, MSN 12/16/2017, 12:34 PM

## 2017-12-16 NOTE — Progress Notes (Signed)
Verbal order received from [facility], CNM to modify pitocin order to 2x2 from 1x1.

## 2017-12-16 NOTE — Progress Notes (Signed)
Subjective: Pt starting to feel more pain.   Objective: BP 105/60   Pulse (!) 104   Temp 98.2 F (36.8 C) (Oral)   Resp 16   Ht 5\' 7"  (1.702 m)   Wt 82.6 kg (182 lb)   LMP 03/22/2017   SpO2 100%   BMI 28.51 kg/m  I/O last 3 completed shifts: In: 895.8 [I.V.:895.8] Out: 800 [Urine:800] Total I/O In: 3086.7 [I.V.:3086.7] Out: 225 [Urine:225]  FHT: Category 1 UC:   regular, every 2-3 minutes SVE:   Dilation: Lip/rim Effacement (%): 100 Station: 0 Exam by:: Latrelle Dodrill, CNM Pitocin at 57mu MVUs 190 mmhg  Assessment:  41yo G1P0 at 39 weeks induction for AMA Cat 1 strip  Plan: Anticipate SVD    Starla Link CNM, MSN 12/16/2017, 9:45 AM

## 2017-12-17 LAB — CBC
HCT: 21.3 % — ABNORMAL LOW (ref 36.0–46.0)
Hemoglobin: 7.3 g/dL — ABNORMAL LOW (ref 12.0–15.0)
MCH: 30.7 pg (ref 26.0–34.0)
MCHC: 34.3 g/dL (ref 30.0–36.0)
MCV: 89.5 fL (ref 78.0–100.0)
PLATELETS: 182 10*3/uL (ref 150–400)
RBC: 2.38 MIL/uL — ABNORMAL LOW (ref 3.87–5.11)
RDW: 14.8 % (ref 11.5–15.5)
WBC: 12 10*3/uL — ABNORMAL HIGH (ref 4.0–10.5)

## 2017-12-17 MED ORDER — FERROUS SULFATE 325 (65 FE) MG PO TABS
325.0000 mg | ORAL_TABLET | Freq: Two times a day (BID) | ORAL | Status: DC
  Administered 2017-12-17 – 2017-12-18 (×3): 325 mg via ORAL
  Filled 2017-12-17 (×3): qty 1

## 2017-12-17 MED ORDER — METHYLERGONOVINE MALEATE 0.2 MG PO TABS: 0.2000 mg | ORAL_TABLET | ORAL | Status: DC | PRN

## 2017-12-17 NOTE — Lactation Note (Signed)
This note was copied from a baby's chart. Lactation Consultation Note  Patient Name: Raven Harper [id number] Date: 12/17/2017 Reason for consult: Initial assessment;1st time breastfeeding Initial visit at 34 hours of age. Mom is 43 years old with + breast changes during pregnancy. Mom has large, long and  Everted nipples.  Baby has had 6+ breast feedings with 4 voids and 4 stools.   Mom reports baby has done well with feedings although she doesn't think baby is getting a deep latch because her nipples are to big.  Mom has been trying to work on hand expression and is not seeing any colostrum yet.   Waldo worked with alternating breasts for several minutes of hand expression and still no colostrum noted.  LC used hand pump already at bedside for a few minutes and still no colostrum noted.   Mom reports baby has been more active for daytime feedings as baby is sleepy now.   LC undressed baby for STS and assisted with latching baby in cross cradle hold.  Baby has active sucking bursts and needs stimulation to maintain sucking.  Baby remains STS with mom with non-nutritive sucking at this time.   Mom to call for latch assist when baby is more awake for feedings.   LC discussed normal feeding behavior and cluster feedings.   Mom to continue to work on hand expression.   [facility] LC resources given and discussed.  Encouraged to feed with early cues on demand.  Early newborn behavior discussed.  Mom to call for assist as needed.        Maternal Data Has patient been taught Hand Expression?: Yes Does the patient have breastfeeding experience prior to this delivery?: No  Feeding Feeding Type: Breast Fed Length of feed: (10 minutes on and off baby sleepy)  LATCH Score Latch: Grasps breast easily, tongue down, lips flanged, rhythmical sucking.  Audible Swallowing: None  Type of Nipple: Everted at rest and after stimulation  Comfort (Breast/Nipple): Soft / non-tender  Hold (Positioning):  Assistance needed to correctly position infant at breast and maintain latch.  LATCH Score: 7  Interventions Interventions: Breast feeding basics reviewed;Support pillows;Assisted with latch;Skin to skin;Hand pump  Lactation Tools Discussed/Used     Consult Status Consult Status: Follow-up Date: 12/18/17 Follow-up type: In-patient    Malena Edman 12/17/2017, 9:44 PM

## 2017-12-17 NOTE — Progress Notes (Addendum)
Post Partum Day 1 Subjective: no complaints; denies dizziness; sob  Objective: Blood pressure 115/73, pulse 86, temperature 97.7 F (36.5 C), temperature source Oral, resp. rate 16, height 5\' 7"  (1.702 m), weight 182 lb (82.6 kg), last menstrual period 03/22/2017, SpO2 100 %, unknown if currently breastfeeding.  Physical Exam:  General: alert and cooperative Lochia: has passed several large size clots Uterine Fundus: firm Incision:  DVT Evaluation: No evidence of DVT seen on physical exam.  Recent Labs    12/15/17 0100 12/17/17 0515  HGB 9.3* 7.3*  HCT 27.7* 21.3*    Assessment/Plan: Plan for discharge tomorrow and Breastfeeding Monitor for Symptomatic anemia Methergine prn Start Iron BID  LOS: 2 days   Miloh Alcocer A Rahsaan Weakland CNM 12/17/2017, 8:10 AM

## 2017-12-17 NOTE — Anesthesia Postprocedure Evaluation (Signed)
Anesthesia Post Note  Patient: Raven Harper  Procedure(s) Performed: AN AD HOC LABOR EPIDURAL     Patient location during evaluation: Mother Baby Anesthesia Type: Epidural Level of consciousness: awake and alert Pain management: pain level controlled Vital Signs Assessment: post-procedure vital signs reviewed and stable Respiratory status: spontaneous breathing Cardiovascular status: blood pressure returned to baseline Postop Assessment: no headache, no backache, epidural receding, adequate PO intake, no apparent nausea or vomiting and patient able to bend at knees Anesthetic complications: no    Last Vitals:  Vitals:   12/17/17 0050 12/17/17 0622  BP: 122/70 115/73  Pulse: (!) 104 86  Resp: 16 16  Temp: 37.1 C 36.5 C  SpO2:      Last Pain:  Vitals:   12/17/17 0622  TempSrc: Oral  PainSc: 7    Pain Goal:                 [facility]

## 2017-12-17 NOTE — Lactation Note (Deleted)
This note was copied from a baby's chart. Lactation Consultation Note  Patient Name: Raven Harper Alfred [id number] Date: 12/17/2017 Reason for consult: Initial assessment  I       Consult Status Consult Status: Follow-up Date: 12/18/17 Follow-up type: In-patient    Malena Edman 12/17/2017, 9:03 PM

## 2017-12-18 MED ORDER — IBUPROFEN 600 MG PO TABS: 600.0000 mg | ORAL_TABLET | Freq: Four times a day (QID) | ORAL | 0 refills | Status: DC

## 2017-12-18 MED ORDER — FERROUS SULFATE 325 (65 FE) MG PO TABS: 325.0000 mg | ORAL_TABLET | Freq: Two times a day (BID) | ORAL | 3 refills | Status: DC

## 2017-12-18 NOTE — Lactation Note (Signed)
This note was copied from a baby's chart. Lactation Consultation Note  Patient Name: Raven Harper [id number] Date: 12/18/2017 Reason for consult: Follow-up assessment;Infant weight loss;1st time breastfeeding;Primapara  Visited with P1 Mom on day of discharge, baby 35 hrs old.  Baby has lost 8.7% since birth.  7 meconium stools, and 2 voids last 24 hrs.  Mom has breastfed >8/24 hrs.    LC observed baby STS, on the right breast in football hold.  Mom using good hand technique and pillow support to facilitate a deep areolar latch.  Observed baby on the breast for 15 mins.  Unable to hear or see any swallows, though baby does have deep jaw extensions with sucking bursts.  Assisted Mom to relax her shoulders, deep breathing, and to use alternate breast compression during feeding.  Mom concerned about her large nipples, and whether baby can latch deeply enough.  Took baby off, and noted nippled was not compressed, and skin intact on both sides.  Hand expression on left breast while baby is feeding on right side, unable to express any colostrum.  Mom has been trying frequently.    Mom does not want to introduce formula as supplementation.  Concerns shared with Mom and FOB about inability to hand express colostrum, and not being able to observe baby swallow on the breast.  Though baby's output is adequate with 7 stools last 24 hrs.  Parents say some of the stools were very large.  Talked about post breast feeding pumping, using DEBP (has a Medela DEBP at home).  RN to set up DEBP at bedside and assist with first pumping.  Talked about extra breast stimulation to encouraged milk volume.  Recommended SNS supplementation at the breast, volume parameter of 7-12 ml per feeding.  Offered to assist with this.  Again, Mom does not want to introduce formula at this time.  Talked about weight check needed within 24 hrs after discharge.  Advised Mom to supplement with EBM+/formula if baby doesn't latch and  breastfeed after 3 hrs when at home.    Mom aware of OP lactation resources available to her.  Encouraged to call prn.  Consult Status Consult Status: Follow-up Date: 12/18/17 Follow-up type: In-patient    [name] 12/18/2017, 9:17 AM

## 2017-12-18 NOTE — Discharge Summary (Signed)
OB Discharge Summary     Patient Name: Raven Harper DOB: February 05, 1976 MRN: [id number]  Date of admission: 12/15/2017 Delivering MD: Starla Link   Date of discharge: 12/18/2017  Admitting diagnosis: 39 WK INDUCTION Intrauterine pregnancy: [redacted]w[redacted]d    Secondary diagnosis:  Active Problems:   Advanced maternal age, primigravida, antepartum, third trimester   SVD (spontaneous vaginal delivery)  Additional problems:None     Discharge diagnosis: Term Pregnancy Delivered                                                                                                Post partum procedures:MMR  Augmentation: AROM, Pitocin, Cytotec and Foley Balloon  Complications: None  Hospital course:  Induction of Labor With Vaginal Delivery   41y.o. yo G1P1001 at 360w1das admitted to the hospital 12/15/2017 for induction of labor.  Indication for induction: AMA.  Patient had an uncomplicated labor course as follows: Membrane Rupture Time/Date: 5:37 AM ,12/16/2017   Intrapartum Procedures: Episiotomy: None [1]                                         Lacerations:  2nd degree [3];Vaginal [6]  Patient had delivery of a Viable infant.  Information for the patient's newborn:  [place], Hirt0[[id number]]Delivery Method: Vag-Spont   12/16/2017  Details of delivery can be found in separate delivery note.  Patient had a routine postpartum course. Patient is discharged home 12/18/17.  Physical exam  Vitals:   12/17/17 0050 12/17/17 0622 12/17/17 1900 12/18/17 0622  BP: 122/70 115/73 106/65 116/71  Pulse: (!) 104 86 (!) 102 94  Resp: _0 Temp: 98.7 F (37.1 C) 97.7 F (36.5 C) 98 F (36.7 C) 97.8 F (36.6 C)  TempSrc: Oral Oral Oral Oral  SpO2:      Weight:      Height:       General: alert, cooperative and no distress Lochia: appropriate Uterine Fundus: firm Incision: N/A DVT Evaluation: No evidence of DVT seen on physical exam. Negative Homan's  sign. Labs: Lab Results  Component Value Date   WBC 12.0 (H) 12/17/2017   HGB 7.3 (L) 12/17/2017   HCT 21.3 (L) 12/17/2017   MCV 89.5 12/17/2017   PLT 182 12/17/2017   No flowsheet data found.  Discharge instruction: per After Visit Summary and "Baby and Me Booklet".  After visit meds:  Allergies as of 12/18/2017   No Known Allergies     Medication List    STOP taking these medications   cyclobenzaprine 10 MG tablet Commonly known as:  FLEXERIL   diphenhydramine-acetaminophen 25-500 MG Tabs tablet Commonly known as:  TYLENOL PM     TAKE these medications   ferrous sulfate 325 (65 FE) MG tablet Take 325 mg by mouth daily with breakfast. What changed:  Another medication with the same name was added. Make sure you understand how and when to take each.   ferrous sulfate 325 (65 FE) MG  tablet Take 1 tablet (325 mg total) by mouth 2 (two) times daily with a meal. What changed:  You were already taking a medication with the same name, and this prescription was added. Make sure you understand how and when to take each.   ibuprofen 600 MG tablet Commonly known as:  ADVIL,MOTRIN Take 1 tablet (600 mg total) by mouth every 6 (six) hours.   pantoprazole 40 MG tablet Commonly known as:  PROTONIX Take 40 mg by mouth at bedtime.   prenatal multivitamin Tabs tablet Take 1 tablet by mouth daily at 12 noon.       Diet: routine diet  Activity: Advance as tolerated. Pelvic rest for 6 weeks.   Outpatient follow up:6 weeks Follow up Appt:No future appointments. Follow up Visit:No Follow-up on file.  Postpartum contraception: Undecided  Newborn Data: Live born female  Birth Weight: 7 lb 13.2 oz (3549 g) APGAR: 9, 9  Newborn Delivery   Birth date/time:  12/16/2017 14:49:00 Delivery type:  Vaginal, Spontaneous     Baby Feeding: Breast Disposition:home with mother   12/18/2017 Starla Link, CNM

## 2017-12-22 ENCOUNTER — Encounter (HOSPITAL_COMMUNITY): Payer: Self-pay | Admitting: *Deleted

## 2017-12-22 ENCOUNTER — Inpatient Hospital Stay (HOSPITAL_COMMUNITY): Payer: BLUE CROSS/BLUE SHIELD

## 2017-12-22 ENCOUNTER — Inpatient Hospital Stay (HOSPITAL_COMMUNITY)
Admission: AD | Admit: 2017-12-22 | Discharge: 2017-12-24 | DRG: 776 | Disposition: A | Discharge status: Home / Self Care | Payer: BLUE CROSS/BLUE SHIELD | Source: Ambulatory Visit | Attending: Obstetrics and Gynecology | Admitting: Obstetrics and Gynecology

## 2017-12-22 DIAGNOSIS — O9089 Other complications of the puerperium, not elsewhere classified: Secondary | ICD-10-CM | POA: Diagnosis present

## 2017-12-22 DIAGNOSIS — D509 Iron deficiency anemia, unspecified: Secondary | ICD-10-CM

## 2017-12-22 DIAGNOSIS — J90 Pleural effusion, not elsewhere classified: Secondary | ICD-10-CM | POA: Diagnosis present

## 2017-12-22 DIAGNOSIS — O9081 Anemia of the puerperium: Secondary | ICD-10-CM | POA: Diagnosis present

## 2017-12-22 DIAGNOSIS — O1495 Unspecified pre-eclampsia, complicating the puerperium: Secondary | ICD-10-CM | POA: Diagnosis present

## 2017-12-22 DIAGNOSIS — O1415 Severe pre-eclampsia, complicating the puerperium: Secondary | ICD-10-CM

## 2017-12-22 DIAGNOSIS — D649 Anemia, unspecified: Secondary | ICD-10-CM | POA: Diagnosis present

## 2017-12-22 LAB — COMPREHENSIVE METABOLIC PANEL
ALK PHOS: 186 U/L — AB (ref 38–126)
ALT: 108 U/L — ABNORMAL HIGH (ref 14–54)
ANION GAP: 9 (ref 5–15)
AST: 134 U/L — ABNORMAL HIGH (ref 15–41)
Albumin: 3.3 g/dL — ABNORMAL LOW (ref 3.5–5.0)
BILIRUBIN TOTAL: 0.7 mg/dL (ref 0.3–1.2)
BUN: 10 mg/dL (ref 6–20)
CALCIUM: 8.5 mg/dL — AB (ref 8.9–10.3)
CO2: 23 mmol/L (ref 22–32)
Chloride: 108 mmol/L (ref 101–111)
Creatinine, Ser: 0.69 mg/dL (ref 0.44–1.00)
GFR calc non Af Amer: >60 mL/min (ref >60)
Glucose, Bld: 87 mg/dL (ref 65–99)
Potassium: 3.5 mmol/L (ref 3.5–5.1)
Sodium: 140 mmol/L (ref 135–145)
TOTAL PROTEIN: 6.1 g/dL — AB (ref 6.5–8.1)

## 2017-12-22 LAB — PROTEIN / CREATININE RATIO, URINE
Creatinine, Urine: 82 mg/dL
Protein Creatinine Ratio: 0.27 mg/mg{Cre} — ABNORMAL HIGH (ref 0.00–0.15)
TOTAL PROTEIN, URINE: 22 mg/dL

## 2017-12-22 LAB — CBC WITH DIFFERENTIAL/PLATELET
BASOS PCT: 0 %
Basophils Absolute: 0 10*3/uL (ref 0.0–0.1)
Eosinophils Absolute: 0 10*3/uL (ref 0.0–0.7)
Eosinophils Relative: 0 %
HEMATOCRIT: 20.6 % — AB (ref 36.0–46.0)
HEMOGLOBIN: 7 g/dL — AB (ref 12.0–15.0)
LYMPHS PCT: 25 %
Lymphs Abs: 1.7 10*3/uL (ref 0.7–4.0)
MCH: 31.1 pg (ref 26.0–34.0)
MCHC: 34 g/dL (ref 30.0–36.0)
MCV: 91.6 fL (ref 78.0–100.0)
MONO ABS: 0.3 10*3/uL (ref 0.1–1.0)
MONOS PCT: 5 %
Neutro Abs: 4.8 10*3/uL (ref 1.7–7.7)
Neutrophils Relative %: 70 %
OTHER: 0 %
Platelets: 224 10*3/uL (ref 150–400)
RBC: 2.25 MIL/uL — ABNORMAL LOW (ref 3.87–5.11)
RDW: 15.4 % (ref 11.5–15.5)
WBC: 6.8 10*3/uL (ref 4.0–10.5)

## 2017-12-22 MED ORDER — HYDRALAZINE HCL 20 MG/ML IJ SOLN: 5.0000 mg | INTRAMUSCULAR | Status: DC | PRN

## 2017-12-22 MED ORDER — PRENATAL MULTIVITAMIN CH
1.0000 | ORAL_TABLET | Freq: Every day | ORAL | Status: DC
  Administered 2017-12-23 – 2017-12-24 (×2): 1 via ORAL
  Filled 2017-12-22 (×2): qty 1

## 2017-12-22 MED ORDER — LACTATED RINGERS IV SOLN
INTRAVENOUS | Status: DC
  Administered 2017-12-22 – 2017-12-23 (×2): via INTRAVENOUS

## 2017-12-22 MED ORDER — SODIUM CHLORIDE 0.9% FLUSH
3.0000 mL | INTRAVENOUS | Status: DC | PRN
  Administered 2017-12-23: 3 mL via INTRAVENOUS
  Filled 2017-12-22: qty 3

## 2017-12-22 MED ORDER — HYDROCHLOROTHIAZIDE 25 MG PO TABS
25.0000 mg | ORAL_TABLET | Freq: Every day | ORAL | Status: DC
  Administered 2017-12-23 – 2017-12-24 (×2): 25 mg via ORAL
  Filled 2017-12-22 (×3): qty 1

## 2017-12-22 MED ORDER — IOPAMIDOL (ISOVUE-370) INJECTION 76%
100.0000 mL | Freq: Once | INTRAVENOUS | Status: AC | PRN
  Administered 2017-12-22: 100 mL via INTRAVENOUS

## 2017-12-22 MED ORDER — FERROUS SULFATE 325 (65 FE) MG PO TABS
325.0000 mg | ORAL_TABLET | Freq: Every day | ORAL | Status: DC
  Administered 2017-12-23 – 2017-12-24 (×2): 325 mg via ORAL
  Filled 2017-12-22 (×2): qty 1

## 2017-12-22 MED ORDER — LABETALOL HCL 5 MG/ML IV SOLN: 20.0000 mg | INTRAVENOUS | Status: DC | PRN

## 2017-12-22 MED ORDER — FUROSEMIDE 10 MG/ML IJ SOLN
20.0000 mg | Freq: Once | INTRAMUSCULAR | Status: AC
  Administered 2017-12-22: 20 mg via INTRAVENOUS
  Filled 2017-12-22: qty 2

## 2017-12-22 MED ORDER — MAGNESIUM SULFATE 40 G IN LACTATED RINGERS - SIMPLE
2.0000 g/h | INTRAVENOUS | Status: AC
  Administered 2017-12-23: 2 g/h via INTRAVENOUS
  Filled 2017-12-22: qty 40
  Filled 2017-12-22: qty 500

## 2017-12-22 MED ORDER — MAGNESIUM SULFATE BOLUS VIA INFUSION
4.0000 g | Freq: Once | INTRAVENOUS | Status: AC
  Administered 2017-12-22: 4 g via INTRAVENOUS
  Filled 2017-12-22: qty 500

## 2017-12-22 MED ORDER — IBUPROFEN 600 MG PO TABS
600.0000 mg | ORAL_TABLET | Freq: Four times a day (QID) | ORAL | Status: DC
  Administered 2017-12-23 – 2017-12-24 (×6): 600 mg via ORAL
  Filled 2017-12-22 (×7): qty 1

## 2017-12-22 NOTE — Progress Notes (Addendum)
Received pt from MAU awake, alert and oriented  X 4. Pt was accompanied by her three day old baby girl, her sister and her spouse. We oriented patient to room and discussed the Plan of Care. All questions were answered and patient verbalized understanding. We also informed patient that we will provider her baby with a crib but we will not be responsible for the baby. Pt started that her husband will be staying with her and he will take care of the baby. Pt was settled comfortable in bed with call bell in close reach.  2104:Pt diurese 900 mls of clear yellow urine post Lasix 20 mg IV. We will continue to monitor.

## 2017-12-22 NOTE — MAU Note (Signed)
Chief Complaint: Shortness of Breath   None    SUBJECTIVE HPI: Raven Harper is a 41 y.o. G1P1001 at Unknown who presents to Maternity Admissions reporting.  Pt presents with complaints of leg swelling and SOB.  Pt had SVD on 12/16/2017.  Pt uncomplicated except for asymptomatic anemia.  Pt sent home on iron BID.  Pt denies headache or blurred vision.  States tired with activity.  Pt breastfeeding.    Location: lower leg edema and SOB Quality: with exertion Duration: few days  SOB worse today Modifying factors:rest   Past Medical History:  Diagnosis Date  . H/O menorrhagia   . Heart murmur    no problems  . Hyperlipidemia    H/O  . Seasonal allergies    OB History  Gravida Para Term Preterm AB Living  1 1 1     1   SAB TAB Ectopic Multiple Live Births        0 1    # Outcome Date GA Lbr Len/2nd Weight Sex Delivery Anes PTL Lv  1 Term 12/16/17 [redacted]w[redacted]d 40:55 / 03:14 3.549 kg (7 lb 13.2 oz) F Vag-Spont EPI  LIV     Birth Comments: n/a     Past Surgical History:  Procedure Laterality Date  . DILATATION & CURRETTAGE/HYSTEROSCOPY WITH RESECTOCOPE Bilateral 06/05/2013   Procedure: DILATATION & CURETTAGE/HYSTEROSCOPY WITH RESECTOCOPE;  Surgeon: Alwyn Pea, MD;  Location: [facility];  Service: Gynecology;  Laterality: Bilateral;  . WISDOM TOOTH EXTRACTION     Social History   Socioeconomic History  . Marital status: Married    Spouse name: Not on file  . Number of children: Not on file  . Years of education: Not on file  . Highest education level: Not on file  Social Needs  . Financial resource strain: Not on file  . Food insecurity - worry: Not on file  . Food insecurity - inability: Not on file  . Transportation needs - medical: Not on file  . Transportation needs - non-medical: Not on file  Occupational History  . Not on file  Tobacco Use  . Smoking status: Never Smoker  . Smokeless tobacco: Never Used  Substance and Sexual Activity  . Alcohol use: Yes     Alcohol/week: 0.6 oz    Types: 1 Glasses of wine per week  . Drug use: No  . Sexual activity: Yes    Birth control/protection: None  Other Topics Concern  . Not on file  Social History Narrative  . Not on file   Family History  Problem Relation Age of Onset  . Cancer Father        PROSTATE  . Lung cancer Maternal Grandmother   . Cancer Maternal Grandmother   . Breast cancer Paternal Grandmother   . Hypertension Mother   . Hyperlipidemia Mother    No current facility-administered medications on file prior to encounter.    Current Outpatient Medications on File Prior to Encounter  Medication Sig Dispense Refill  . ferrous sulfate 325 (65 FE) MG tablet Take 325 mg by mouth daily with breakfast.    . ferrous sulfate 325 (65 FE) MG tablet Take 1 tablet (325 mg total) by mouth 2 (two) times daily with a meal. 60 tablet 3  . ibuprofen (ADVIL,MOTRIN) 600 MG tablet Take 1 tablet (600 mg total) by mouth every 6 (six) hours. 30 tablet 0  . pantoprazole (PROTONIX) 40 MG tablet Take 40 mg by mouth at bedtime.    . Prenatal Vit-Fe Fumarate-FA (  PRENATAL MULTIVITAMIN) TABS tablet Take 1 tablet by mouth daily at 12 noon.     No Known Allergies  I have reviewed patient's Past Medical Hx, Surgical Hx, Family Hx, Social Hx, medications and allergies.   Review of Systems  Constitutional: Positive for fatigue.  HENT: Negative.   Eyes: Negative.   Respiratory: Positive for shortness of breath.   Cardiovascular: Positive for leg swelling.  Gastrointestinal: Negative.   Endocrine: Negative.   Genitourinary: Positive for vaginal bleeding.  Musculoskeletal: Negative.   Allergic/Immunologic: Negative.   Neurological: Negative.   Hematological: Negative.   Psychiatric/Behavioral: Negative.     OBJECTIVE Patient Vitals for the past 24 hrs:  BP Temp Pulse Resp SpO2 Height Weight  12/22/17 1536 137/71 - 76 - 100 % - -  12/22/17 1521 (!) 141/79 - 76 - - - -  12/22/17 1518 - - - - 100 % - -   12/22/17 1513 (!) 151/93 99.2 F (37.3 C) 84 18 - 5\' 7"  (1.702 m) 82.1 kg (181 lb)   Constitutional: Well-developed, well-nourished female in no acute distress.  Cardiovascular: normal rate Respiratory: normal rate and effort.  No decreased breath sounds  GI: Abd soft, non-tender, gravid appropriate for gestational age. Pos BS x 4 MS: Extremities nontender, 1+ pitting edema in ankles, trace edema to knees normal ROM Neurologic: Alert and oriented x 4.   LAB RESULTS No results found for this or any previous visit (from the past 24 hour(s)). EKG NSR with sinus arrhythmia  Results for orders placed or performed during the hospital encounter of 12/22/17 (from the past 24 hour(s))  Protein / creatinine ratio, urine     Status: Abnormal   Collection Time: 12/22/17  3:34 PM  Result Value Ref Range   Creatinine, Urine 82.00 mg/dL   Total Protein, Urine 22 mg/dL   Protein Creatinine Ratio 0.27 (H) 0.00 - 0.15 mg/mg[Cre]  CBC with Differential/Platelet     Status: Abnormal   Collection Time: 12/22/17  4:29 PM  Result Value Ref Range   WBC 6.8 4.0 - 10.5 K/uL   RBC 2.25 (L) 3.87 - 5.11 MIL/uL   Hemoglobin 7.0 (L) 12.0 - 15.0 g/dL   HCT 20.6 (L) 36.0 - 46.0 %   MCV 91.6 78.0 - 100.0 fL   MCH 31.1 26.0 - 34.0 pg   MCHC 34.0 30.0 - 36.0 g/dL   RDW 15.4 11.5 - 15.5 %   Platelets 224 150 - 400 K/uL   Neutrophils Relative % 70 %   Lymphocytes Relative 25 %   Monocytes Relative 5 %   Eosinophils Relative 0 %   Basophils Relative 0 %   Other 0 %   Neutro Abs 4.8 1.7 - 7.7 K/uL   Lymphs Abs 1.7 0.7 - 4.0 K/uL   Monocytes Absolute 0.3 0.1 - 1.0 K/uL   Eosinophils Absolute 0.0 0.0 - 0.7 K/uL   Basophils Absolute 0.0 0.0 - 0.1 K/uL  Comprehensive metabolic panel     Status: Abnormal   Collection Time: 12/22/17  4:29 PM  Result Value Ref Range   Sodium 140 135 - 145 mmol/L   Potassium 3.5 3.5 - 5.1 mmol/L   Chloride 108 101 - 111 mmol/L   CO2 23 22 - 32 mmol/L   Glucose, Bld 87 65 - 99  mg/dL   BUN 10 6 - 20 mg/dL   Creatinine, Ser 0.69 0.44 - 1.00 mg/dL   Calcium 8.5 (L) 8.9 - 10.3 mg/dL   Total Protein 6.1 (L)  6.5 - 8.1 g/dL   Albumin 3.3 (L) 3.5 - 5.0 g/dL   AST 134 (H) 15 - 41 U/L   ALT 108 (H) 14 - 54 U/L   Alkaline Phosphatase 186 (H) 38 - 126 U/L   Total Bilirubin 0.7 0.3 - 1.2 mg/dL   GFR calc non Af Amer >60 >60 mL/min   GFR calc Af Amer >60 >60 mL/min   Anion gap 9 5 - 15    IMAGING CT chest angiogram  IMPRESSION: 1. No CT evidence of pulmonary embolism. 2. Small bilateral pleural effusions. MAU COURSE Orders Placed This Encounter  Procedures  . Protein / creatinine ratio, urine   No orders of the defined types were placed in this encounter.   MDM PE, labs, CT scan, EKG, VS.  Elevated liver enzymes and pleural effusion on CT,High BP.  Results discussed with Dr. Charlesetta Garibaldi.  Will admit pt for treatment. ASSESSMENT Postpartum pre eclampsia Bilateral pleural effusion Anemia PLAN Admit     Starla Link, CNM 12/22/2017  3:58 PM

## 2017-12-22 NOTE — Progress Notes (Signed)
   12/22/17 2210  Vital Signs  BP 126/69  BP Location Left Arm  Patient Position (if appropriate) Semi-fowlers  BP Method Automatic  Pulse Rate 87  Pulse Rate Source Monitor  Resp 16  Oxygen Therapy  SpO2 100 %  O2 Device Room Air  Magnesium 4g bolus completed, 2g started. Pt remain awake, alert and oriented x 4. RR 16 even and unlabored. Reflex 2+, no clonus, denies pain or discomfort. We will continue to monitor.

## 2017-12-22 NOTE — MAU Note (Signed)
Post partum 12/16/2017. Pt c/o feeling SOB and swelling in her feet.Denies any pain or discomfort at this time. No visual changes reported.

## 2017-12-22 NOTE — Progress Notes (Signed)
   12/22/17 2145  Vital Signs  BP (!) 149/83  BP Location Left Arm  Patient Position (if appropriate) Semi-fowlers  BP Method Automatic  Pulse Rate 66  Pulse Rate Source Monitor  Resp 18  Oxygen Therapy  SpO2 100 %  O2 Device Room Air  Magnesium 4 g bolus initiated @2147 . Pt remain awake, alert and oriented x 4. Reflex 2+, no clonus, RR even and unlabored, denies pain or discomfort.

## 2017-12-22 NOTE — H&P (Signed)
Chief Complaint: Shortness of Breath   None    SUBJECTIVE HPI: Raven Harper is a 41 y.o. G1P1001 at Unknown who presents to Maternity Admissions reporting.  Pt presents with complaints of leg swelling and SOB.  Pt had SVD on 12/16/2017.  Pt uncomplicated except for asymptomatic anemia.  Pt sent home on iron BID.  Pt denies headache or blurred vision.  States tired with activity.  Pt breastfeeding.    Location: lower leg edema and SOB Quality: with exertion Duration: few days  SOB worse today Modifying factors:rest       Past Medical History:  Diagnosis Date  . H/O menorrhagia   . Heart murmur    no problems  . Hyperlipidemia    H/O  . Seasonal allergies                    OB History  Gravida Para Term Preterm AB Living  1 1 1     1   SAB TAB Ectopic Multiple Live Births        0 1    # Outcome Date GA Lbr Len/2nd Weight Sex Delivery Anes PTL Lv  1 Term 12/16/17 [redacted]w[redacted]d 40:55 / 03:14 3.549 kg (7 lb 13.2 oz) F Vag-Spont EPI  LIV     Birth Comments: n/a          Past Surgical History:  Procedure Laterality Date  . DILATATION & CURRETTAGE/HYSTEROSCOPY WITH RESECTOCOPE Bilateral 06/05/2013   Procedure: DILATATION & CURETTAGE/HYSTEROSCOPY WITH RESECTOCOPE;  Surgeon: Alwyn Pea, MD;  Location: [facility];  Service: Gynecology;  Laterality: Bilateral;  . WISDOM TOOTH EXTRACTION     Social History        Socioeconomic History  . Marital status: Married    Spouse name: Not on file  . Number of children: Not on file  . Years of education: Not on file  . Highest education level: Not on file  Social Needs  . Financial resource strain: Not on file  . Food insecurity - worry: Not on file  . Food insecurity - inability: Not on file  . Transportation needs - medical: Not on file  . Transportation needs - non-medical: Not on file  Occupational History  . Not on file  Tobacco Use  . Smoking status: Never Smoker  . Smokeless tobacco: Never Used   Substance and Sexual Activity  . Alcohol use: Yes    Alcohol/week: 0.6 oz    Types: 1 Glasses of wine per week  . Drug use: No  . Sexual activity: Yes    Birth control/protection: None  Other Topics Concern  . Not on file  Social History Narrative  . Not on file        Family History  Problem Relation Age of Onset  . Cancer Father        PROSTATE  . Lung cancer Maternal Grandmother   . Cancer Maternal Grandmother   . Breast cancer Paternal Grandmother   . Hypertension Mother   . Hyperlipidemia Mother    No current facility-administered medications on file prior to encounter.          Current Outpatient Medications on File Prior to Encounter  Medication Sig Dispense Refill  . ferrous sulfate 325 (65 FE) MG tablet Take 325 mg by mouth daily with breakfast.    . ferrous sulfate 325 (65 FE) MG tablet Take 1 tablet (325 mg total) by mouth 2 (two) times daily with a meal. 60 tablet 3  .  ibuprofen (ADVIL,MOTRIN) 600 MG tablet Take 1 tablet (600 mg total) by mouth every 6 (six) hours. 30 tablet 0  . pantoprazole (PROTONIX) 40 MG tablet Take 40 mg by mouth at bedtime.    . Prenatal Vit-Fe Fumarate-FA (PRENATAL MULTIVITAMIN) TABS tablet Take 1 tablet by mouth daily at 12 noon.     No Known Allergies  I have reviewed patient's Past Medical Hx, Surgical Hx, Family Hx, Social Hx, medications and allergies.   Review of Systems  Constitutional: Positive for fatigue.  HENT: Negative.   Eyes: Negative.   Respiratory: Positive for shortness of breath.   Cardiovascular: Positive for leg swelling.  Gastrointestinal: Negative.   Endocrine: Negative.   Genitourinary: Positive for vaginal bleeding.  Musculoskeletal: Negative.   Allergic/Immunologic: Negative.   Neurological: Negative.   Hematological: Negative.   Psychiatric/Behavioral: Negative.     OBJECTIVE Patient Vitals for the past 24 hrs:  BP Temp Pulse Resp SpO2 Height Weight  12/22/17 1536  137/71 - 76 - 100 % - -  12/22/17 1521 (!) 141/79 - 76 - - - -  12/22/17 1518 - - - - 100 % - -  12/22/17 1513 (!) 151/93 99.2 F (37.3 C) 84 18 - 5\' 7"  (1.702 m) 82.1 kg (181 lb)   Constitutional: Well-developed, well-nourished female in no acute distress.  Cardiovascular: normal rate Respiratory: normal rate and effort.  No decreased breath sounds  GI: Abd soft, non-tender, gravid appropriate for gestational age. Pos BS x 4 MS: Extremities nontender, 1+ pitting edema in ankles, trace edema to knees normal ROM Neurologic: Alert and oriented x 4.   LAB RESULTS LabResultsLast24Hours  No results found for this or any previous visit (from the past 24 hour(s)).   EKG NSR with sinus arrhythmia  LabResultsLast24Hours       Results for orders placed or performed during the hospital encounter of 12/22/17 (from the past 24 hour(s))  Protein / creatinine ratio, urine     Status: Abnormal   Collection Time: 12/22/17  3:34 PM  Result Value Ref Range   Creatinine, Urine 82.00 mg/dL   Total Protein, Urine 22 mg/dL   Protein Creatinine Ratio 0.27 (H) 0.00 - 0.15 mg/mg[Cre]  CBC with Differential/Platelet     Status: Abnormal   Collection Time: 12/22/17  4:29 PM  Result Value Ref Range   WBC 6.8 4.0 - 10.5 K/uL   RBC 2.25 (L) 3.87 - 5.11 MIL/uL   Hemoglobin 7.0 (L) 12.0 - 15.0 g/dL   HCT 20.6 (L) 36.0 - 46.0 %   MCV 91.6 78.0 - 100.0 fL   MCH 31.1 26.0 - 34.0 pg   MCHC 34.0 30.0 - 36.0 g/dL   RDW 15.4 11.5 - 15.5 %   Platelets 224 150 - 400 K/uL   Neutrophils Relative % 70 %   Lymphocytes Relative 25 %   Monocytes Relative 5 %   Eosinophils Relative 0 %   Basophils Relative 0 %   Other 0 %   Neutro Abs 4.8 1.7 - 7.7 K/uL   Lymphs Abs 1.7 0.7 - 4.0 K/uL   Monocytes Absolute 0.3 0.1 - 1.0 K/uL   Eosinophils Absolute 0.0 0.0 - 0.7 K/uL   Basophils Absolute 0.0 0.0 - 0.1 K/uL  Comprehensive metabolic panel     Status: Abnormal   Collection Time:  12/22/17  4:29 PM  Result Value Ref Range   Sodium 140 135 - 145 mmol/L   Potassium 3.5 3.5 - 5.1 mmol/L   Chloride 108 101 -  111 mmol/L   CO2 23 22 - 32 mmol/L   Glucose, Bld 87 65 - 99 mg/dL   BUN 10 6 - 20 mg/dL   Creatinine, Ser 0.69 0.44 - 1.00 mg/dL   Calcium 8.5 (L) 8.9 - 10.3 mg/dL   Total Protein 6.1 (L) 6.5 - 8.1 g/dL   Albumin 3.3 (L) 3.5 - 5.0 g/dL   AST 134 (H) 15 - 41 U/L   ALT 108 (H) 14 - 54 U/L   Alkaline Phosphatase 186 (H) 38 - 126 U/L   Total Bilirubin 0.7 0.3 - 1.2 mg/dL   GFR calc non Af Amer >60 >60 mL/min   GFR calc Af Amer >60 >60 mL/min   Anion gap 9 5 - 15      IMAGING CT chest angiogram  IMPRESSION: 1. No CT evidence of pulmonary embolism. 2. Small bilateral pleural effusions. MAU COURSE    Orders Placed This Encounter  Procedures  . Protein / creatinine ratio, urine   No orders of the defined types were placed in this encounter.   MDM PE, labs, CT scan, EKG, VS.  Elevated liver enzymes and pleural effusion on CT,High BP.  Results discussed with Dr. Charlesetta Garibaldi.  Will admit pt for treatment. ASSESSMENT Postpartum pre eclampsia Bilateral pleural effusion Anemia  PLAN Admit  Lasix 20 meq IV Magnesium Sulfate 4 gm loading dose than 2 gm per hour. Daily weights HCTZ 25mg  daily Repeat labs tomorrow.

## 2017-12-23 ENCOUNTER — Other Ambulatory Visit: Payer: Self-pay

## 2017-12-23 LAB — CBC WITH DIFFERENTIAL/PLATELET
BAND NEUTROPHILS: 0 %
BASOS ABS: 0 10*3/uL (ref 0.0–0.1)
BLASTS: 0 %
Basophils Relative: 0 %
EOS ABS: 0 10*3/uL (ref 0.0–0.7)
EOS PCT: 0 %
HCT: 22.8 % — ABNORMAL LOW (ref 36.0–46.0)
HEMOGLOBIN: 7.6 g/dL — AB (ref 12.0–15.0)
LYMPHS ABS: 2.1 10*3/uL (ref 0.7–4.0)
Lymphocytes Relative: 34 %
MCH: 30.8 pg (ref 26.0–34.0)
MCHC: 33.3 g/dL (ref 30.0–36.0)
MCV: 92.3 fL (ref 78.0–100.0)
METAMYELOCYTES PCT: 0 %
MYELOCYTES: 0 %
Monocytes Absolute: 0 10*3/uL — ABNORMAL LOW (ref 0.1–1.0)
Monocytes Relative: 0 %
Neutro Abs: 4.2 10*3/uL (ref 1.7–7.7)
Neutrophils Relative %: 66 %
Other: 0 %
Platelets: 227 10*3/uL (ref 150–400)
Promyelocytes Absolute: 0 %
RBC: 2.47 MIL/uL — AB (ref 3.87–5.11)
RDW: 15.7 % — ABNORMAL HIGH (ref 11.5–15.5)
WBC: 6.3 10*3/uL (ref 4.0–10.5)
nRBC: 1 /100 WBC — ABNORMAL HIGH

## 2017-12-23 LAB — COMPREHENSIVE METABOLIC PANEL
ALK PHOS: 181 U/L — AB (ref 38–126)
ALT: 122 U/L — AB (ref 14–54)
AST: 136 U/L — AB (ref 15–41)
Albumin: 3.3 g/dL — ABNORMAL LOW (ref 3.5–5.0)
Anion gap: 10 (ref 5–15)
BUN: 9 mg/dL (ref 6–20)
CALCIUM: 7.7 mg/dL — AB (ref 8.9–10.3)
CHLORIDE: 104 mmol/L (ref 101–111)
CO2: 23 mmol/L (ref 22–32)
CREATININE: 0.63 mg/dL (ref 0.44–1.00)
GFR calc non Af Amer: >60 mL/min (ref >60)
GLUCOSE: 95 mg/dL (ref 65–99)
Potassium: 3.6 mmol/L (ref 3.5–5.1)
SODIUM: 137 mmol/L (ref 135–145)
Total Bilirubin: 0.6 mg/dL (ref 0.3–1.2)
Total Protein: 6.2 g/dL — ABNORMAL LOW (ref 6.5–8.1)

## 2017-12-23 MED ORDER — SALINE SPRAY 0.65 % NA SOLN
1.0000 | NASAL | Status: DC | PRN
  Administered 2017-12-23: 1 via NASAL
  Filled 2017-12-23: qty 44

## 2017-12-23 NOTE — Progress Notes (Signed)
Post Partum Day 7 with pp preclampsia.   Subjective: no complaints, tolerating PO and no SOB, CP or RUQ pain  Objective: Blood pressure 111/65, pulse 72, temperature 98.3 F (36.8 C), temperature source Oral, resp. rate 16, height 5\' 7"  (1.702 m), weight 79.8 kg (176 lb 0.1 oz), SpO2 98 %, unknown if currently breastfeeding.  Physical Exam:  General: alert and cooperative  cv RRR Lungs CTAB Lochia: appropriate Uterine Fundus: firm Incision: na DVT Evaluation: Negative Homan's sign. 2 plus DTR B  Recent Labs    12/22/17 1629 12/23/17 0947  HGB 7.0* 7.6*  HCT 20.6* 22.8*    Assessment/Plan: Plan for discharge tomorrow  Pt doing well Plan to DC mag tonight    LOS: 1 day   Raven Harper 12/23/2017, 12:39 PM

## 2017-12-23 NOTE — Lactation Note (Signed)
Lactation Consultation Note  Patient Name: Raven Harper SWHQP'R Date: 12/23/2017   Mom requested to see Lactation.  Readmitted last evening for MgSO4 for GHTN at 7 days post partum.  Mom and [name] has been to see Lactation Consultant as an outpatient.  Mom states baby had lost "a pound", and recently started on formula supplementation.  Weight up 3 oz.   Baby here in room, and just had breastfed.  Mom has not pumped since yesterday.  Recommended she pump after each breastfeeding to stimulate her milk supply.  RN instructed that Mom needs her DEBP set up and assistance with her first pumping.    Recommended she continue to follow up with Lactation after she is discharged tomorrow.    [name] 12/23/2017, 4:21 PM

## 2017-12-23 NOTE — Progress Notes (Signed)
Dr. Charlesetta Garibaldi in to assess Patient. Verbal order received to discontinue Magnesium Sulfate 2 g/hr tonight at 2200.

## 2017-12-23 NOTE — Progress Notes (Signed)
Hospital day # 1 Postpartum Day 7 Readmit  S: B/p stable,no complaints O: BP 123/75 (BP Location: Left Arm)   Pulse 67   Temp 98.1 F (36.7 C) (Oral)   Resp 16   Ht 5\' 7"  (1.702 m)   Wt 176 lb 0.1 oz (79.8 kg)   SpO2 98%   BMI 27.57 kg/m          Uterus non-tender      Extremities: no significant edema and no signs of DVT Patient Vitals for the past 24 hrs:  BP Temp Temp src Pulse Resp SpO2 Height Weight  12/23/17 0629 123/75 - - 67 16 98 % - 176 lb 0.1 oz (79.8 kg)  12/23/17 0356 129/71 - - 70 16 - - -  12/23/17 0200 118/72 - - 81 16 - - -  12/23/17 0101 126/71 - - 80 18 - - -  12/23/17 0001 136/75 - - 94 16 - - -  12/22/17 2325 124/75 - - 76 16 99 % - -  12/22/17 2210 126/69 - - 87 16 100 % - -  12/22/17 2145 (!) 149/83 - - 66 18 100 % - -  12/22/17 2010 (!) 159/89 98.1 F (36.7 C) Oral 62 18 100 % - -  12/22/17 1914 (!) 155/98 - - - - - - -  12/22/17 1846 (!) 162/98 - - 72 - - - -  12/22/17 1831 (!) 157/92 - - 64 - - - -  12/22/17 1816 (!) 168/88 - - (!) 59 - - - -  12/22/17 1809 (!) 132/95 - - (!) 58 - - - -  12/22/17 1731 140/83 - - 69 - - - -  12/22/17 1715 (!) 144/83 - - 66 - - - -  12/22/17 1700 134/82 - - 69 - 100 % - -  12/22/17 1646 (!) 142/79 - - 74 - - - -  12/22/17 1601 139/88 - - 76 - 100 % - -  12/22/17 1536 137/71 - - 76 - 100 % - -  12/22/17 1521 (!) 141/79 - - 76 - - - -  12/22/17 1518 - - - - - 100 % - -  12/22/17 1513 (!) 151/93 99.2 F (37.3 C) - 84 18 - 5\' 7"  (1.702 m) 181 lb (82.1 kg)   Results for orders placed or performed during the hospital encounter of 12/22/17 (from the past 24 hour(s))  Protein / creatinine ratio, urine     Status: Abnormal   Collection Time: 12/22/17  3:34 PM  Result Value Ref Range   Creatinine, Urine 82.00 mg/dL   Total Protein, Urine 22 mg/dL   Protein Creatinine Ratio 0.27 (H) 0.00 - 0.15 mg/mg[Cre]  CBC with Differential/Platelet     Status: Abnormal   Collection Time: 12/22/17  4:29 PM  Result Value Ref Range    WBC 6.8 4.0 - 10.5 K/uL   RBC 2.25 (L) 3.87 - 5.11 MIL/uL   Hemoglobin 7.0 (L) 12.0 - 15.0 g/dL   HCT 20.6 (L) 36.0 - 46.0 %   MCV 91.6 78.0 - 100.0 fL   MCH 31.1 26.0 - 34.0 pg   MCHC 34.0 30.0 - 36.0 g/dL   RDW 15.4 11.5 - 15.5 %   Platelets 224 150 - 400 K/uL   Neutrophils Relative % 70 %   Lymphocytes Relative 25 %   Monocytes Relative 5 %   Eosinophils Relative 0 %   Basophils Relative 0 %   Other 0 %  Neutro Abs 4.8 1.7 - 7.7 K/uL   Lymphs Abs 1.7 0.7 - 4.0 K/uL   Monocytes Absolute 0.3 0.1 - 1.0 K/uL   Eosinophils Absolute 0.0 0.0 - 0.7 K/uL   Basophils Absolute 0.0 0.0 - 0.1 K/uL  Comprehensive metabolic panel     Status: Abnormal   Collection Time: 12/22/17  4:29 PM  Result Value Ref Range   Sodium 140 135 - 145 mmol/L   Potassium 3.5 3.5 - 5.1 mmol/L   Chloride 108 101 - 111 mmol/L   CO2 23 22 - 32 mmol/L   Glucose, Bld 87 65 - 99 mg/dL   BUN 10 6 - 20 mg/dL   Creatinine, Ser 0.69 0.44 - 1.00 mg/dL   Calcium 8.5 (L) 8.9 - 10.3 mg/dL   Total Protein 6.1 (L) 6.5 - 8.1 g/dL   Albumin 3.3 (L) 3.5 - 5.0 g/dL   AST 134 (H) 15 - 41 U/L   ALT 108 (H) 14 - 54 U/L   Alkaline Phosphatase 186 (H) 38 - 126 U/L   Total Bilirubin 0.7 0.3 - 1.2 mg/dL   GFR calc non Af Amer >60 >60 mL/min   GFR calc Af Amer >60 >60 mL/min   Anion gap 9 5 - 15    Intake/Output Summary (Last 24 hours) at 12/23/2017 0658 Last data filed at 12/23/2017 4132 Gross per 24 hour  Intake 886.16 ml  Output 2800 ml  Net -1913.84 ml   A: Postpartum Preeclampsia     Stable       P: continue current plan of care      Morning labs pending Raven Harper  CNM 12/23/2017 6:55 AM

## 2017-12-23 NOTE — Progress Notes (Signed)
   12/23/17 2217  Vital Signs  BP 133/85  BP Location Left Arm  Patient Position (if appropriate) Sitting  BP Method Automatic  Pulse Rate 81  Pulse Rate Source Monitor  Resp 16  Oxygen Therapy  SpO2 100 %  O2 Device Room Air  Magnesium D/C;d as ordered. PIV flushed and NSL. Pt alert and oriented x 4, denies headache, denies son, denies blurred vision and epigastric pain. We will continue to monitor.

## 2017-12-24 LAB — CBC
HCT: 20.6 % — ABNORMAL LOW (ref 36.0–46.0)
Hemoglobin: 7 g/dL — ABNORMAL LOW (ref 12.0–15.0)
MCH: 31.3 pg (ref 26.0–34.0)
MCHC: 34 g/dL (ref 30.0–36.0)
MCV: 92 fL (ref 78.0–100.0)
PLATELETS: 226 10*3/uL (ref 150–400)
RBC: 2.24 MIL/uL — ABNORMAL LOW (ref 3.87–5.11)
RDW: 15.7 % — AB (ref 11.5–15.5)
WBC: 5.9 10*3/uL (ref 4.0–10.5)

## 2017-12-24 LAB — COMPREHENSIVE METABOLIC PANEL
ALT: 92 U/L — ABNORMAL HIGH (ref 14–54)
ANION GAP: 11 (ref 5–15)
AST: 72 U/L — AB (ref 15–41)
Albumin: 3 g/dL — ABNORMAL LOW (ref 3.5–5.0)
Alkaline Phosphatase: 152 U/L — ABNORMAL HIGH (ref 38–126)
BILIRUBIN TOTAL: 0.7 mg/dL (ref 0.3–1.2)
BUN: 18 mg/dL (ref 6–20)
CHLORIDE: 105 mmol/L (ref 101–111)
CO2: 23 mmol/L (ref 22–32)
Calcium: 7.4 mg/dL — ABNORMAL LOW (ref 8.9–10.3)
Creatinine, Ser: 0.9 mg/dL (ref 0.44–1.00)
GFR calc Af Amer: >60 mL/min (ref >60)
Glucose, Bld: 105 mg/dL — ABNORMAL HIGH (ref 65–99)
POTASSIUM: 3.4 mmol/L — AB (ref 3.5–5.1)
Sodium: 139 mmol/L (ref 135–145)
TOTAL PROTEIN: 5.7 g/dL — AB (ref 6.5–8.1)

## 2017-12-24 LAB — MAGNESIUM: MAGNESIUM: 3.2 mg/dL — AB (ref 1.7–2.4)

## 2017-12-24 MED ORDER — HYDROCHLOROTHIAZIDE 25 MG PO TABS: 25.0000 mg | ORAL_TABLET | Freq: Every day | ORAL | 0 refills | Status: DC

## 2017-12-24 MED ORDER — SALINE SPRAY 0.65 % NA SOLN: 1.0000 | NASAL | 0 refills | Status: AC | PRN

## 2017-12-24 NOTE — Progress Notes (Signed)
Hospital day # 2  Postpartum  S: B/p's wnl. No complaints; denies headache, visual disturbances  O: BP 126/62   Pulse 76   Temp 98.6 F (37 C) (Oral)   Resp 16   Ht 5\' 7"  (1.702 m)   Wt 176 lb 0.1 oz (79.8 kg)   SpO2 100%   BMI 27.57 kg/m           Uterus non-tender      Extremities: no significant edema and no signs of DVT  A: Postpartum Preeclampsia     gradually improving  P: continue current plan of care     AM labs pending  Bertram Gala Clemmons  CNM 12/24/2017 3:48 AM

## 2017-12-24 NOTE — Discharge Summary (Addendum)
Obstetric Discharge Summary Admission date: 12/22/17 Discharge date: 11/3117 Reason for Admission: Postpartum preeclampsia Shortness of breath, elevated BPs, lower extremities swelling.    Postpartum Procedures: Magnesium sulfate, lasix Complications-Operative and Postpartum: none   Labs during admission:   CBC    Component Value Date/Time   WBC 5.9 12/24/2017 0514   RBC 2.24 (L) 12/24/2017 0514   HGB 7.0 (L) 12/24/2017 0514   HCT 20.6 (L) 12/24/2017 0514   PLT 226 12/24/2017 0514   MCV 92.0 12/24/2017 0514   MCV 92.3 02/10/2015 1442   MCH 31.3 12/24/2017 0514   MCHC 34.0 12/24/2017 0514   RDW 15.7 (H) 12/24/2017 0514   LYMPHSABS 2.1 12/23/2017 0947   MONOABS 0.0 (L) 12/23/2017 0947   EOSABS 0.0 12/23/2017 0947   BASOSABS 0.0 12/23/2017 0947   CMP     Component Value Date/Time   NA 139 12/24/2017 0514   K 3.4 (L) 12/24/2017 0514   CL 105 12/24/2017 0514   CO2 23 12/24/2017 0514   GLUCOSE 105 (H) 12/24/2017 0514   BUN 18 12/24/2017 0514   CREATININE 0.90 12/24/2017 0514   CALCIUM 7.4 (L) 12/24/2017 0514   PROT 5.7 (L) 12/24/2017 0514   ALBUMIN 3.0 (L) 12/24/2017 0514   AST 72 (H) 12/24/2017 0514   ALT 92 (H) 12/24/2017 0514   ALKPHOS 152 (H) 12/24/2017 0514   BILITOT 0.7 12/24/2017 0514   GFRNONAA >60 12/24/2017 0514   GFRAA >60 12/24/2017 0514   CMP Latest Ref Rng & Units 12/24/2017 12/23/2017 12/22/2017  Glucose 65 - 99 mg/dL 105(H) 95 87  BUN 6 - 20 mg/dL 18 9 10   Creatinine 0.44 - 1.00 mg/dL 0.90 0.63 0.69  Sodium 135 - 145 mmol/L 139 137 140  Potassium 3.5 - 5.1 mmol/L 3.4(L) 3.6 3.5  Chloride 101 - 111 mmol/L 105 104 108  CO2 22 - 32 mmol/L 23 23 23   Calcium 8.9 - 10.3 mg/dL 7.4(L) 7.7(L) 8.5(L)  Total Protein 6.5 - 8.1 g/dL 5.7(L) 6.2(L) 6.1(L)  Total Bilirubin 0.3 - 1.2 mg/dL 0.7 0.6 0.7  Alkaline Phos 38 - 126 U/L 152(H) 181(H) 186(H)  AST 15 - 41 U/L 72(H) 136(H) 134(H)  ALT 14 - 54 U/L 92(H) 122(H) 108(H)    Hemoglobin  Date Value Ref  Range Status  12/24/2017 7.0 (L) 12.0 - 15.0 g/dL Final   HCT  Date Value Ref Range Status  12/24/2017 20.6 (L) 36.0 - 46.0 % Final   Hospital Course: Patient presented on PPD #6 with complaints of shortness of breath, headache and lower extremities swelling.  She was found to have high BPs on admission.   Preeclampsia labs came back significant for elevated LFTs and anemia, but otherwise normal.  Chest CT was negative for a pulmonary embolus but showed small bilateral effusions.  She received magnesium sulfate for seizure prophylaxis as well as lasix for fluid management. HCTZ helped to control her blood pressures.  On day of discharge she reported feeling better and desired to be discharged home.  She denied any problems with ambulation.  She denied lightheadedness or dizziness.  Her LFTs were down trending.  She denied any preeclampsia symptoms.  She was deemed stable for discharge.     Physical Exam:  General: alert, cooperative and no distress  CVS: s1, S2, RRR Pulm: Clear to auscultation bilaterally Lochia: appropriate Uterine Fundus: firm DVT Evaluation: Calf/Ankle edema is present, 1+ bilaterally.   Discharge Diagnoses: Postpartum preeclampsia, Anemia.   Discharge Information: Date: 12/24/2017 Activity: pelvic rest  Diet: routine Medications: Ibuprofen, Iron and Hydrochlorothiazide. Over counter colace as needed.  Condition: stable Discharge to: home Follow-up Information    Waymon Amato, MD. Go in 1 week(s).   Specialty:  Obstetrics and Gynecology Why:  My office will call you to make an appointment for 1 week, please call office wednesday afternoon if no one reaches out to you.  Contact information: [employer] [address] [phone]          Alinda Dooms, MD 12/24/2017, 11:59 AM

## 2017-12-24 NOTE — Discharge Instructions (Signed)
Anemia Anemia is a condition in which you do not have enough red blood cells or hemoglobin. Hemoglobin is a substance in red blood cells that carries oxygen. When you do not have enough red blood cells or hemoglobin (are anemic), your body cannot get enough oxygen and your organs may not work properly. As a result, you may feel very tired or have other problems. What are the causes? Common causes of anemia include:  Excessive bleeding. Anemia can be caused by excessive bleeding inside or outside the body, including bleeding from the intestine or from periods in women.  Poor nutrition.  Long-lasting (chronic) kidney, thyroid, and liver disease.  Bone marrow disorders.  Cancer and treatments for cancer.  HIV (human immunodeficiency virus) and AIDS (acquired immunodeficiency syndrome).  Treatments for HIV and AIDS.  Spleen problems.  Blood disorders.  Infections, medicines, and autoimmune disorders that destroy red blood cells.  What are the signs or symptoms? Symptoms of this condition include:  Minor weakness.  Dizziness.  Headache.  Feeling heartbeats that are irregular or faster than normal (palpitations).  Shortness of breath, especially with exercise.  Paleness.  Cold sensitivity.  Indigestion.  Nausea.  Difficulty sleeping.  Difficulty concentrating.  Symptoms may occur suddenly or develop slowly. If your anemia is mild, you may not have symptoms. How is this diagnosed? This condition is diagnosed based on:  Blood tests.  Your medical history.  A physical exam.  Bone marrow biopsy.  Your health care provider may also check your stool (feces) for blood and may do additional testing to look for the cause of your bleeding. You may also have other tests, including:  Imaging tests, such as a CT scan or MRI.  Endoscopy.  Colonoscopy.  How is this treated? Treatment for this condition depends on the cause. If you continue to lose a lot of blood,  you may need to be treated at a hospital. Treatment may include:  Taking supplements of iron, vitamin T02, or folic acid.  Taking a hormone medicine (erythropoietin) that can help to stimulate red blood cell growth.  Having a blood transfusion. This may be needed if you lose a lot of blood.  Making changes to your diet.  Having surgery to remove your spleen.  Follow these instructions at home:  Take over-the-counter and prescription medicines only as told by your health care provider.  Take supplements only as told by your health care provider.  Follow any diet instructions that you were given.  Keep all follow-up visits as told by your health care provider. This is important. Contact a health care provider if:  You develop new bleeding anywhere in the body. Get help right away if:  You are very weak.  You are short of breath.  You have pain in your abdomen or chest.  You are dizzy or feel faint.  You have trouble concentrating.  You have bloody or black, tarry stools.  You vomit repeatedly or you vomit up blood. Summary  Anemia is a condition in which you do not have enough red blood cells or enough of a substance in your red blood cells that carries oxygen (hemoglobin).  Symptoms may occur suddenly or develop slowly.  If your anemia is mild, you may not have symptoms.  This condition is diagnosed with blood tests as well as a medical history and physical exam. Other tests may be needed.  Treatment for this condition depends on the cause of the anemia. This information is not intended to replace advice  given to you by your health care provider. Make sure you discuss any questions you have with your health care provider. °Document Released: 01/18/2005 Document Revised: 01/12/2017 Document Reviewed: 01/12/2017 °Elsevier Interactive Patient Education © 2018 [employer] ° °

## 2017-12-24 NOTE — Progress Notes (Signed)
Discharge instructions given to pt. Discussed signs and symptoms that need to be reported to the MD. Pt verbalized understanding and was able to verbalize discharge instructions back to me. IV was removed and pt tolerated well. Pt walked down to car in stable position.

## 2018-01-04 IMAGING — CT CT ANGIO CHEST
1 of 2 series · 19 of 32 positions shown · IV contrast (OMNIPAQUE)
Comparison: Chest CT dated 02/11/2015

CLINICAL DATA: 41-year-old recent postpartum female with shortness
of breath. Concern for pulmonary embolism.

EXAM:
CT ANGIOGRAPHY CHEST WITH CONTRAST
TECHNIQUE: Multidetector CT imaging of the chest was performed using the
standard protocol during bolus administration of intravenous
contrast. Multiplanar CT image reconstructions and MIPs were
obtained to evaluate the vascular anatomy.
CONTRAST:  100mL 2OVZOC-JU9 IOPAMIDOL (2OVZOC-JU9) INJECTION 76%

[Series 6: (person_name) thins · axial · 0.71mm/px · z∈[+993,+1254]mm · 19 of 416 slices shown]
[im 21/416  lung]
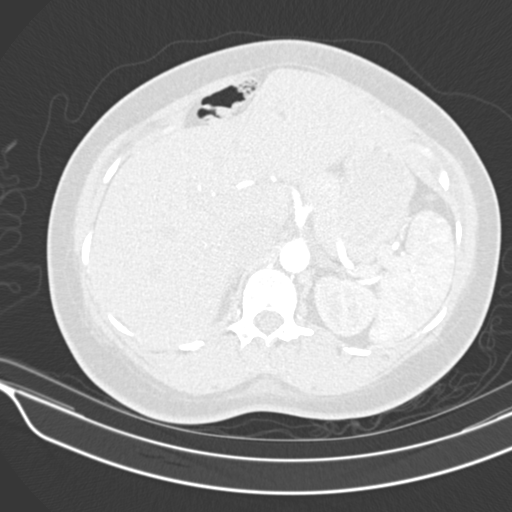
[im 42/416  mediastinal]
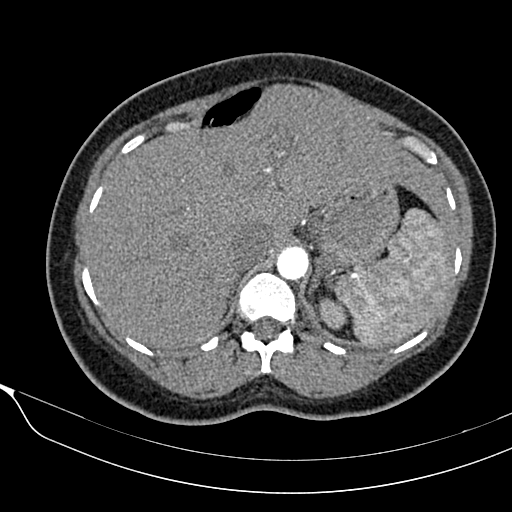
[im 63/416  lung]
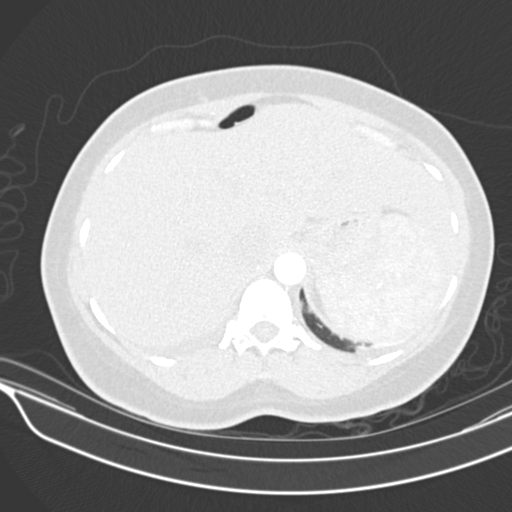
[im 104/416  mediastinal]
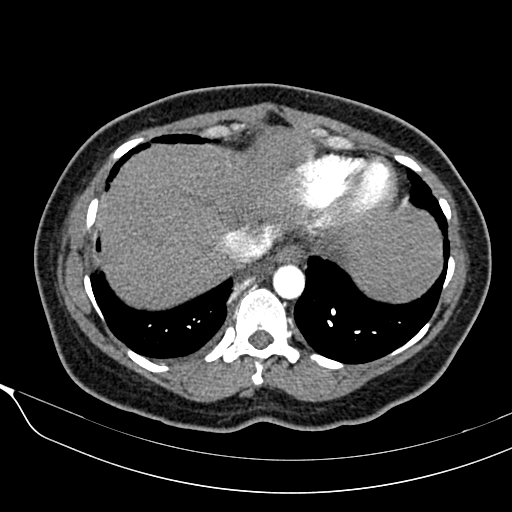
[im 125/416  lung]
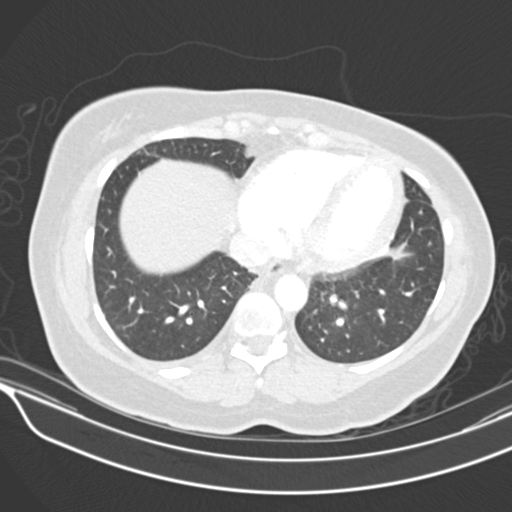
[im 139/416  mediastinal]
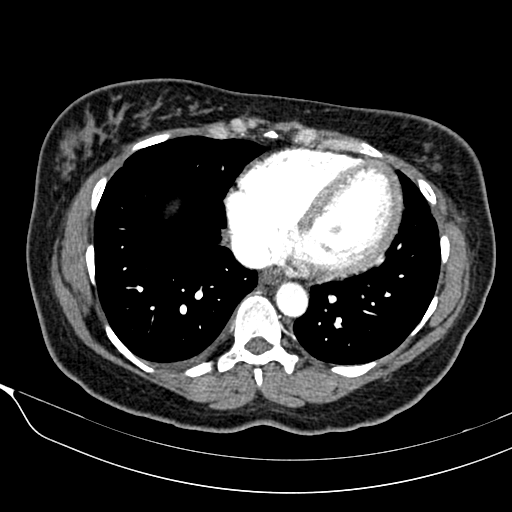
[im 146/416  lung]
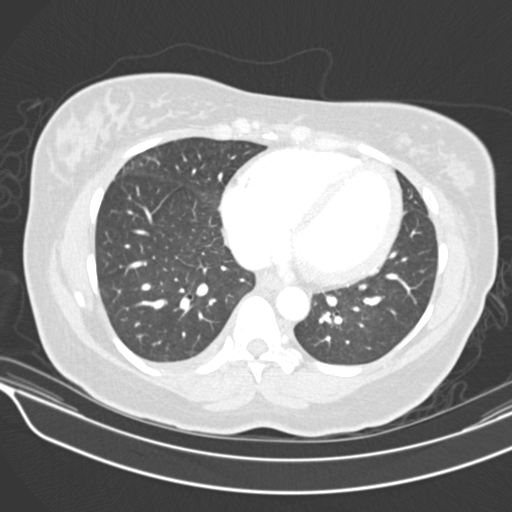
[im 167/416  mediastinal]
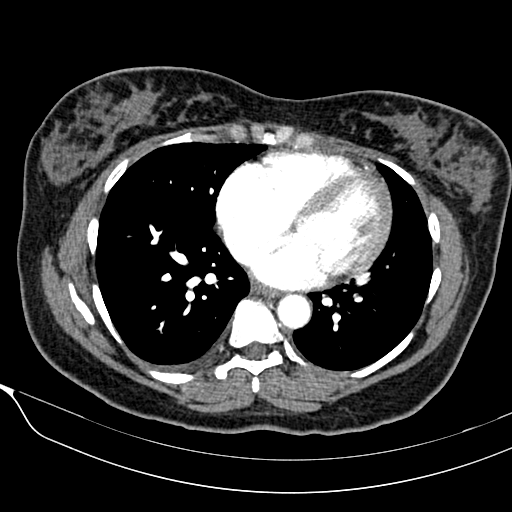
[im 187/416  lung]
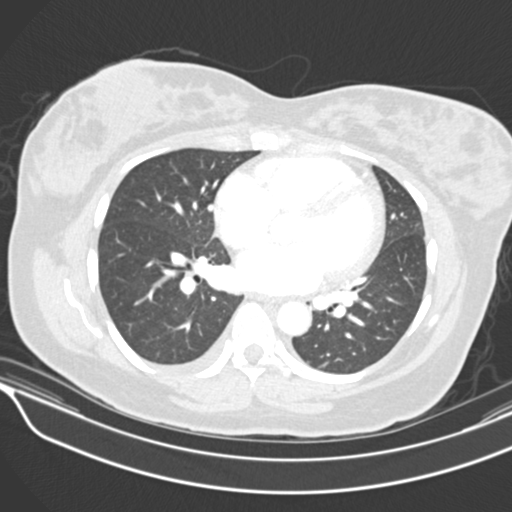
[im 208/416  mediastinal]
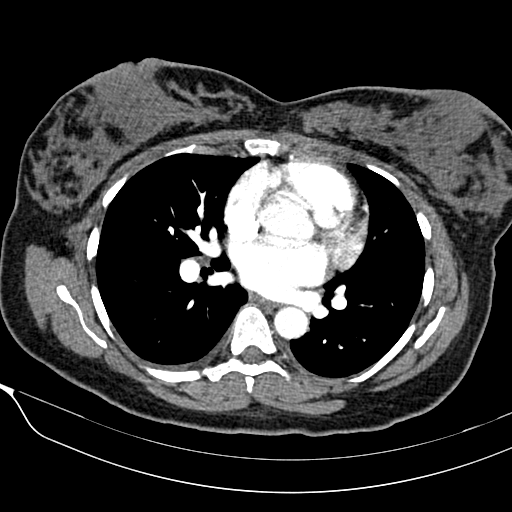
[im 229/416  lung]
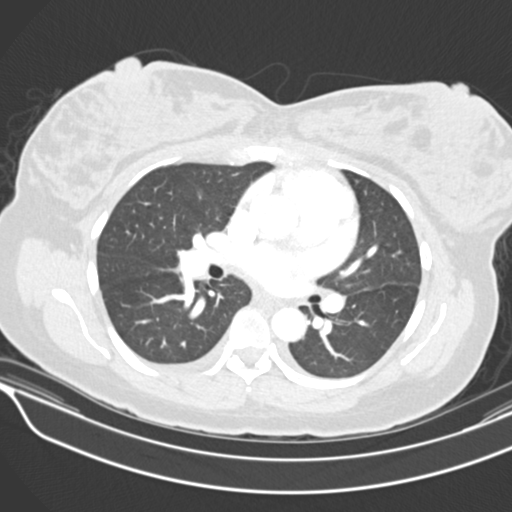
[im 250/416  mediastinal]
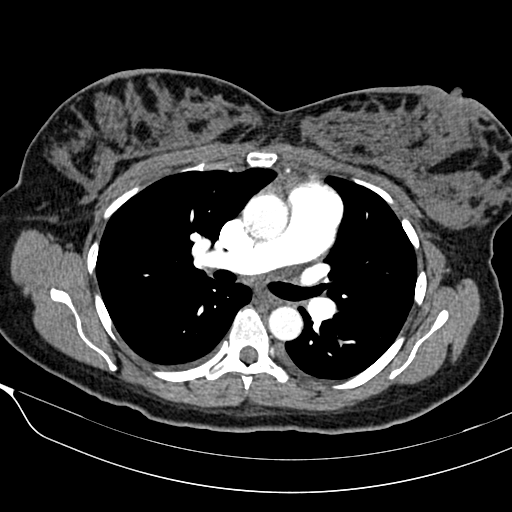
[im 270/416  lung]
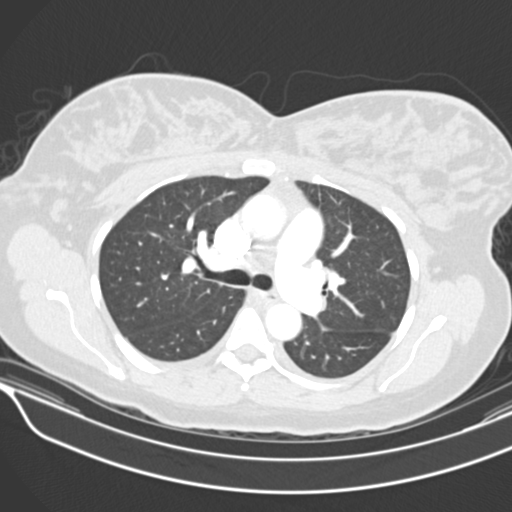
[im 277/416  mediastinal]
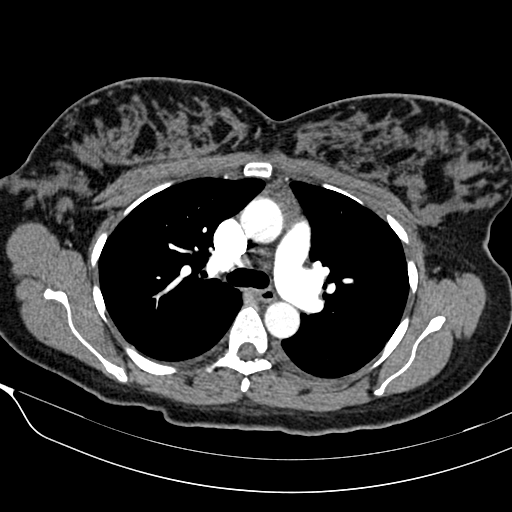
[im 291/416  lung]
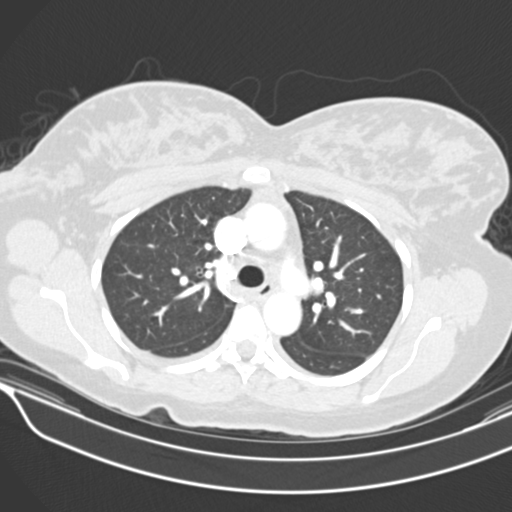
[im 312/416  mediastinal]
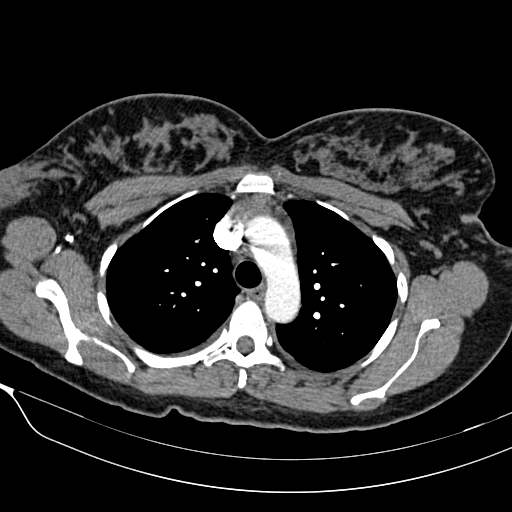
[im 353/416  lung]
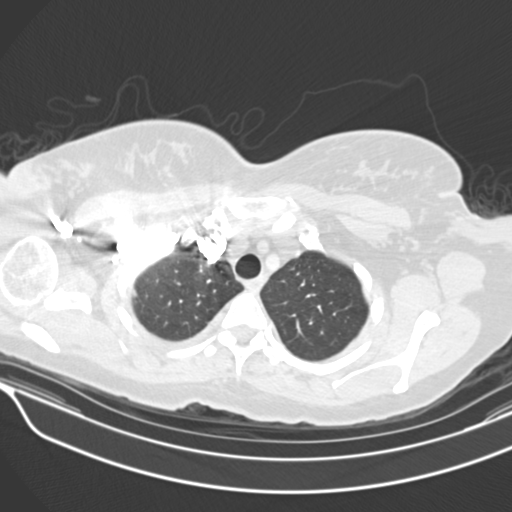
[im 374/416  mediastinal]
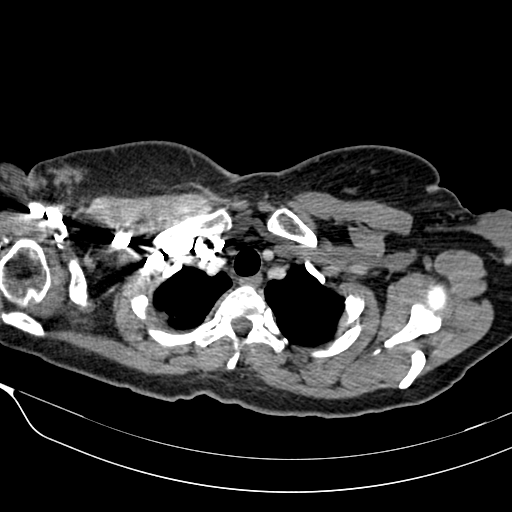
[im 395/416  lung]
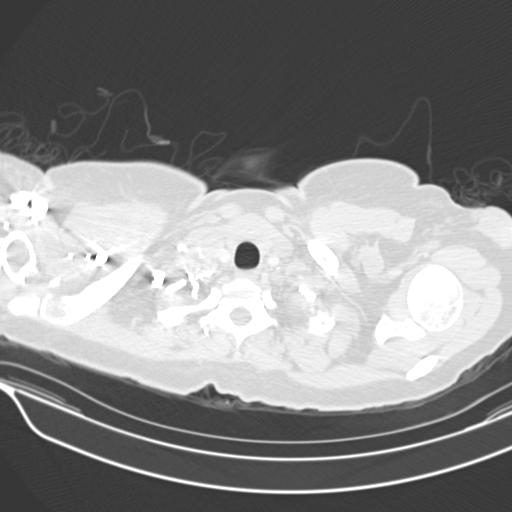

[19 of 32 positions shown; findings below may reference images not displayed]

FINDINGS: Cardiovascular: There is no cardiomegaly or pericardial effusion.
The thoracic aorta is unremarkable. There is no CT evidence of
pulmonary embolism.

Mediastinum/Nodes: No hilar or mediastinal adenopathy. Esophagus is
grossly unremarkable.

Lungs/Pleura: Small bilateral pleural effusions. There is no focal
consolidation or pneumothorax. The central airways are patent.

Upper Abdomen: No acute abnormality.

Musculoskeletal: No acute osseous pathology.

Review of the MIP images confirms the above findings.
IMPRESSION: 1. No CT evidence of pulmonary embolism.
2. Small bilateral pleural effusions.

## 2018-04-09 ENCOUNTER — Other Ambulatory Visit: Payer: Self-pay | Admitting: Obstetrics and Gynecology

## 2018-04-09 DIAGNOSIS — Z1231 Encounter for screening mammogram for malignant neoplasm of breast: Secondary | ICD-10-CM

## 2018-04-29 ENCOUNTER — Ambulatory Visit: Payer: Self-pay

## 2018-12-25 HISTORY — PX: OTHER SURGICAL HISTORY: SHX169

## 2019-09-09 ENCOUNTER — Ambulatory Visit
Admission: RE | Admit: 2019-09-09 | Discharge: 2019-09-09 | Disposition: A | Discharge status: Home / Self Care | Payer: BC Managed Care – PPO | Source: Ambulatory Visit | Attending: Family Medicine | Admitting: Family Medicine

## 2019-09-09 ENCOUNTER — Other Ambulatory Visit: Payer: Self-pay | Admitting: Family Medicine

## 2019-09-09 DIAGNOSIS — M25552 Pain in left hip: Secondary | ICD-10-CM
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
## 2021-01-04 ENCOUNTER — Other Ambulatory Visit: Payer: BC Managed Care – PPO

## 2021-02-16 ENCOUNTER — Other Ambulatory Visit (HOSPITAL_COMMUNITY): Payer: Self-pay | Admitting: Internal Medicine

## 2021-02-16 DIAGNOSIS — D649 Anemia, unspecified: Secondary | ICD-10-CM

## 2021-02-22 ENCOUNTER — Encounter (HOSPITAL_COMMUNITY): Payer: Self-pay

## 2021-02-24 ENCOUNTER — Non-Acute Institutional Stay (HOSPITAL_COMMUNITY)
Admission: RE | Admit: 2021-02-24 | Discharge: 2021-02-24 | Disposition: A | Discharge status: Home / Self Care | Payer: 59 | Source: Ambulatory Visit | Attending: Internal Medicine | Admitting: Internal Medicine

## 2021-02-24 ENCOUNTER — Other Ambulatory Visit: Payer: Self-pay

## 2021-02-24 DIAGNOSIS — D649 Anemia, unspecified: Secondary | ICD-10-CM | POA: Diagnosis present

## 2021-02-24 DIAGNOSIS — Z9289 Personal history of other medical treatment: Secondary | ICD-10-CM

## 2021-02-24 HISTORY — DX: Personal history of other medical treatment: Z92.89

## 2021-02-24 LAB — PREPARE RBC (CROSSMATCH)

## 2021-02-24 MED ORDER — SODIUM CHLORIDE 0.9% IV SOLUTION: Freq: Once | INTRAVENOUS | Status: AC

## 2021-02-24 NOTE — Discharge Instructions (Signed)

## 2021-02-24 NOTE — Progress Notes (Signed)
Type and screen lab drawn. Patient received IV 1 unit of packed red blood cells as ordered by Linna Darner, MD. Tolerated well, vitals stable, discharge instructions given, verbalized understanding. Alert, oriented and ambulatory at the time of discharge.

## 2021-02-25 LAB — TYPE AND SCREEN
ABO/RH(D): A POS
Antibody Screen: NEGATIVE
Unit division: 0

## 2021-02-25 LAB — BPAM RBC
Blood Product Expiration Date: 202203292359
ISSUE DATE / TIME: 202203031024
Unit Type and Rh: 6200

## 2021-03-04 ENCOUNTER — Encounter (HOSPITAL_COMMUNITY): Payer: Self-pay

## 2021-04-29 ENCOUNTER — Other Ambulatory Visit: Payer: Self-pay | Admitting: Obstetrics and Gynecology

## 2021-05-13 ENCOUNTER — Other Ambulatory Visit: Payer: Self-pay | Admitting: Obstetrics and Gynecology

## 2021-05-17 ENCOUNTER — Other Ambulatory Visit: Payer: Self-pay | Admitting: Obstetrics and Gynecology

## 2021-05-25 ENCOUNTER — Other Ambulatory Visit: Payer: 59

## 2021-05-25 ENCOUNTER — Other Ambulatory Visit: Payer: Self-pay | Admitting: Family

## 2021-05-25 ENCOUNTER — Ambulatory Visit: Payer: 59 | Admitting: Family

## 2021-05-25 DIAGNOSIS — D649 Anemia, unspecified: Secondary | ICD-10-CM

## 2021-05-25 NOTE — H&P (Addendum)
Raven Harper is a 45 y.o.  female P: 1-0-0-1, presents for hysterectomy because of menorrhagia, uterine fibroids and anemia. Over the past year the patient's periods have become increasingly heavier such that she has to wear a diaper in order not to soil her clothing or linen. Her flow will last for 7 days with the heaviest being the first 3 days and changes her diaper 3 times a day. She goes on to report cramping that is rated 10/10 that is only minimally relieved with Ibuprofen and Percocet. A pelvic ultrasound in February 2022 revealed an anteverted uterus (13.5 from fundus to external os): 10.53 x 11.03 x 10.99 cm, endometrium not visualize due to fibroid, mid-intramural fibroid-10.31 cm; right ovary-4.78 cm and left ovary-3.83 cm.   Her hemoglobin has been as low as 7 and in early March 2022 she was transfused a unit of blood. Her hemoglobin May 12, 2021 was 8.3. An endometrial biopsy also done in May returned benign findings She denies problems with urination, bowel function, vaginitis symptoms or fever.   Also in March 2022 the patient underwent hysteroscopy and an attempted  ablation procedure however due to failed multiple assessments of the uterine cavity the ablation procedure could not be performed. The patient was prescribed Lysteda with no decrease in her bleeding.  She was then given a trial of  Orilissa and Norethindrone 10 mg daily which resulted in  her only requiring  1 diaper change per day though her cramping persisted. . A review of both medical and surgical management options were given to the patient however, she has decided to proceed with definitive therapy in the form of hysterectomy.Past Medical History  OB History: G: 1; P: 1-0-0-1; SVB 2018 (weight 7 lbs. 13 oz)  GYN History: menarche: 45 YO;  LMP: see HPI;  The patient denies history of an abnormal PAP Smear.   Last PAP smear: 04/2021-normal with negative HPV  Medical History: Heart Murmur, Hyperlipidemia and  Uterine  Fibroids  Surgical History: 2014 Hysteroscopy D & C                               2022 Hysteroscopy with Failed Ablation Denies problems with anesthesia.  Has a history of a  blood transfusion March 2022 (1 unit of PRBC)  Family History: Lung Cancer, Prostate Cancer, Breast Cancer, Vulvar Cancer and Cardiovascular Disease  Social History: Married and employed in Oncologist at [employer];   Denies tobacco use and occasionally consumes alcohol   Medications: Ibuprofen 600 mg po every 6 hours pc prn Norethindrone 5 mg  #2 po daily Trazadone 50 mg po  qhs prn Percocet 5/325 po every 6 hours prn No Known Allergies  Denies sensitivity to peanuts, shellfish, soy, latex or adhesives.   ROS: Admits to reading  glasses, shortness of breath with anemia, and generalized swelling but  denies headache, vision changes, nasal congestion, dysphagia, tinnitus, dizziness, hoarseness, cough,  chest pain, nausea, vomiting, diarrhea,constipation,  urinary frequency, urgency  dysuria, hematuria, vaginitis symptoms, pelvic pain, swelling of joints,easy bruising,  myalgias, arthralgias, skin rashes, unexplained weight loss and except as is mentioned in the history of present illness, patient's review of systems is otherwise negative.     Physical Exam  Bp: 108/68; P: 88 bpm;  R: 16;  Temperature: 98.4 degrees F orally; Height: 5'7";  Weight: 161 lbs.;  BMI: 25.2  Neck: supple without masses or thyromegaly Lungs: clear to auscultation Heart: regular  rate and rhythm Abdomen: firm mass from pelvis to 2 fingers above symphysis pubis;  non-tender and no organomegaly Pelvic:EGBUS- wnl; vagina-normal rugae; uterus: 12-14 weeks size cervix without lesions or motion tenderness; adnexae-no tenderness or masses Extremities:  no clubbing, cyanosis or edema   Assesment: Menorrhagia                      Anemia                      Uterine Fibroids   Disposition:  A discussion was held with patient  regarding the indication for her procedure(s) along with the risks, which include but are not limited to: reaction to anesthesia, damage to adjacent organs, infection,  excessive bleeding and the possible need for an open abdominal incision.  The patient verbalized understanding of these risks and has consented to proceed with a Total Vaginal Hysterectomy, Bilateral Salpingectomy,  Possible Bilateral Oophorectomy and Cystoscopy at [facility] on June 06, 2021.   CSN# [id number]   Raven J. Florene Glen, PA-C  for Dr. Harvie Bridge. Mancel Bale  Risks benefits alternatives reviewed with the patient including but not limited to bleeding infection and injury.  Patient is agreaable to blood transfusion and understands the risks benefits and alternatives.  Questions answered and consent signed and witnessed.

## 2021-05-27 ENCOUNTER — Inpatient Hospital Stay (HOSPITAL_BASED_OUTPATIENT_CLINIC_OR_DEPARTMENT_OTHER): Payer: 59 | Admitting: Family

## 2021-05-27 ENCOUNTER — Encounter: Payer: Self-pay | Admitting: Family

## 2021-05-27 ENCOUNTER — Inpatient Hospital Stay: Payer: 59 | Attending: Family

## 2021-05-27 ENCOUNTER — Other Ambulatory Visit: Payer: Self-pay

## 2021-05-27 DIAGNOSIS — E785 Hyperlipidemia, unspecified: Secondary | ICD-10-CM | POA: Diagnosis not present

## 2021-05-27 DIAGNOSIS — D259 Leiomyoma of uterus, unspecified: Secondary | ICD-10-CM | POA: Insufficient documentation

## 2021-05-27 DIAGNOSIS — R011 Cardiac murmur, unspecified: Secondary | ICD-10-CM | POA: Diagnosis not present

## 2021-05-27 DIAGNOSIS — G4700 Insomnia, unspecified: Secondary | ICD-10-CM | POA: Insufficient documentation

## 2021-05-27 DIAGNOSIS — N921 Excessive and frequent menstruation with irregular cycle: Secondary | ICD-10-CM | POA: Diagnosis present

## 2021-05-27 DIAGNOSIS — Z79899 Other long term (current) drug therapy: Secondary | ICD-10-CM | POA: Diagnosis not present

## 2021-05-27 DIAGNOSIS — D500 Iron deficiency anemia secondary to blood loss (chronic): Secondary | ICD-10-CM

## 2021-05-27 DIAGNOSIS — R0602 Shortness of breath: Secondary | ICD-10-CM | POA: Diagnosis not present

## 2021-05-27 DIAGNOSIS — D649 Anemia, unspecified: Secondary | ICD-10-CM

## 2021-05-27 DIAGNOSIS — R531 Weakness: Secondary | ICD-10-CM | POA: Diagnosis not present

## 2021-05-27 DIAGNOSIS — R5383 Other fatigue: Secondary | ICD-10-CM | POA: Diagnosis not present

## 2021-05-27 DIAGNOSIS — R002 Palpitations: Secondary | ICD-10-CM | POA: Diagnosis not present

## 2021-05-27 DIAGNOSIS — D509 Iron deficiency anemia, unspecified: Secondary | ICD-10-CM | POA: Insufficient documentation

## 2021-05-27 LAB — CBC WITH DIFFERENTIAL (CANCER CENTER ONLY)
Abs Immature Granulocytes: 0.04 10*3/uL (ref 0.00–0.07)
Basophils Absolute: 0 10*3/uL (ref 0.0–0.1)
Basophils Relative: 0 %
Eosinophils Absolute: 0.1 10*3/uL (ref 0.0–0.5)
Eosinophils Relative: 2 %
HCT: 29.2 % — ABNORMAL LOW (ref 36.0–46.0)
Hemoglobin: 8.6 g/dL — ABNORMAL LOW (ref 12.0–15.0)
Immature Granulocytes: 1 %
Lymphocytes Relative: 38 %
Lymphs Abs: 1.9 10*3/uL (ref 0.7–4.0)
MCH: 21.2 pg — ABNORMAL LOW (ref 26.0–34.0)
MCHC: 29.5 g/dL — ABNORMAL LOW (ref 30.0–36.0)
MCV: 71.9 fL — ABNORMAL LOW (ref 80.0–100.0)
Monocytes Absolute: 0.4 10*3/uL (ref 0.1–1.0)
Monocytes Relative: 8 %
Neutro Abs: 2.6 10*3/uL (ref 1.7–7.7)
Neutrophils Relative %: 51 %
Platelet Count: 360 10*3/uL (ref 150–400)
RBC: 4.06 MIL/uL (ref 3.87–5.11)
RDW: 18.9 % — ABNORMAL HIGH (ref 11.5–15.5)
WBC Count: 5.1 10*3/uL (ref 4.0–10.5)
nRBC: 0 % (ref 0.0–0.2)

## 2021-05-27 LAB — CMP (CANCER CENTER ONLY)
ALT: 23 U/L (ref 0–44)
AST: 33 U/L (ref 15–41)
Albumin: 4.5 g/dL (ref 3.5–5.0)
Alkaline Phosphatase: 28 U/L — ABNORMAL LOW (ref 38–126)
Anion gap: 8 (ref 5–15)
BUN: 10 mg/dL (ref 6–20)
CO2: 25 mmol/L (ref 22–32)
Calcium: 9.8 mg/dL (ref 8.9–10.3)
Chloride: 106 mmol/L (ref 98–111)
Creatinine: 0.82 mg/dL (ref 0.44–1.00)
GFR, Estimated: >60 mL/min (ref >60)
Glucose, Bld: 96 mg/dL (ref 70–99)
Potassium: 4.1 mmol/L (ref 3.5–5.1)
Sodium: 139 mmol/L (ref 135–145)
Total Bilirubin: 0.2 mg/dL — ABNORMAL LOW (ref 0.3–1.2)
Total Protein: 7.4 g/dL (ref 6.5–8.1)

## 2021-05-27 LAB — SAMPLE TO BLOOD BANK

## 2021-05-27 LAB — IRON AND TIBC
Iron: 16 ug/dL — ABNORMAL LOW (ref 41–142)
Saturation Ratios: 3 % — ABNORMAL LOW (ref 21–57)
TIBC: 581 ug/dL — ABNORMAL HIGH (ref 236–444)
UIBC: 566 ug/dL — ABNORMAL HIGH (ref 120–384)

## 2021-05-27 LAB — FERRITIN: Ferritin: 17 ng/mL (ref 11–307)

## 2021-05-27 LAB — LACTATE DEHYDROGENASE: LDH: 251 U/L — ABNORMAL HIGH (ref 98–192)

## 2021-05-27 LAB — RETICULOCYTES
Immature Retic Fract: 26.6 % — ABNORMAL HIGH (ref 2.3–15.9)
RBC.: 4 MIL/uL (ref 3.87–5.11)
Retic Count, Absolute: 62 10*3/uL (ref 19.0–186.0)
Retic Ct Pct: 1.6 % (ref 0.4–3.1)

## 2021-05-27 NOTE — Progress Notes (Signed)
Hematology/Oncology Consultation   Name: Raven Harper      MRN: [id number]    Location: Room/bed info not found  Date: 05/27/2021 Time:9:09 AM   REFERRING PHYSICIAN: Everett Graff, MD  REASON FOR CONSULT: Anemia    DIAGNOSIS: Iron deficiency anemia secondary to heavy cycles  HISTORY OF PRESENT ILLNESS: Raven Harper is a very pleasant 45 yo female with a large uterine fibroid and heavy, irregular cycle. She states that she has been bleeding since April and had to receive blood in March for Hgb 6.8.  She states that she has had anemia since giving birth to her daughter 3 years ago.  Her sisters and paternal aunt also have history of anemia with heavy cycles and one sister has required transfusional support.  She is symptomatic with fatigue, weakness, SOB with stairs, palpitations, puffiness in hands and feet, insomnia and craving/chewing ice.  She is scheduled to have a hysterectomy on 06/06/2021 with Dr. Octavio Manns.  No other blood loss noted. No bruising or petechiae.  No history of miscarriage.  No known sickle cell disease or trait.  No personal history of diabetes or thyroid disease.  No personal history of cancer. Family history includes mother - vulvar, father - prostate, paternal grandmother - breast and maternal grandmother - lung (smoker).  No fever, chills, n/v, cough, rash, dizziness, chest pain, abdominal pain or changes in bowel or bladder habits.  No swelling or tenderness in her extremities.  She has leg cramps sometimes at night.  She has intermittent numbness and tingling in her hands and states that she has history of poor circulation.  No falls or syncope to report.  No smoking or recreational drug use.  She does states that she drinks wine most nights since her mother passed away last year. This has been difficult for her. She states that she has a good support system with her sisters and was blessed with a wonderful mom.  She has maintained a good appetite and is  staying well hydrated. Her weight is described as stable.   ROS: All other 10 point review of systems is negative.   PAST MEDICAL HISTORY:   Past Medical History:  Diagnosis Date  . H/O menorrhagia   . Heart murmur    no problems  . Hyperlipidemia    H/O  . Seasonal allergies     ALLERGIES: No Known Allergies    MEDICATIONS:  Current Outpatient Medications on File Prior to Visit  Medication Sig Dispense Refill  . diphenhydrAMINE (BENADRYL ALLERGY) 25 mg capsule Take 50 mg by mouth at bedtime.    . ferrous sulfate 325 (65 FE) MG tablet Take 1 tablet (325 mg total) by mouth 2 (two) times daily with a meal. 60 tablet 3  . hydrochlorothiazide (HYDRODIURIL) 25 MG tablet Take 1 tablet (25 mg total) by mouth daily. 7 tablet 0  . ibuprofen (ADVIL,MOTRIN) 600 MG tablet Take 1 tablet (600 mg total) by mouth every 6 (six) hours. 30 tablet 0  . pantoprazole (PROTONIX) 40 MG tablet Take 40 mg by mouth at bedtime.    . Prenatal Vit-Fe Fumarate-FA (PRENATAL MULTIVITAMIN) TABS tablet Take 1 tablet by mouth daily at 12 noon.    . sodium chloride (OCEAN) 0.65 % SOLN nasal spray Place 1 spray into both nostrils as needed for congestion. 1 Bottle 0   No current facility-administered medications on file prior to visit.     PAST SURGICAL HISTORY Past Surgical History:  Procedure Laterality Date  . DILATATION & CURRETTAGE/HYSTEROSCOPY  WITH RESECTOCOPE Bilateral 06/05/2013   Procedure: DILATATION & CURETTAGE/HYSTEROSCOPY WITH RESECTOCOPE;  Surgeon: Alwyn Pea, MD;  Location: [facility];  Service: Gynecology;  Laterality: Bilateral;  . WISDOM TOOTH EXTRACTION      FAMILY HISTORY: Family History  Problem Relation Age of Onset  . Cancer Father        PROSTATE  . Lung cancer Maternal Grandmother   . Cancer Maternal Grandmother   . Breast cancer Paternal Grandmother   . Hypertension Mother   . Hyperlipidemia Mother     SOCIAL HISTORY:  reports that she has never smoked. She has never used  smokeless tobacco. She reports current alcohol use of about 1.0 standard drink of alcohol per week. She reports that she does not use drugs.  PERFORMANCE STATUS: The patient's performance status is 1 - Symptomatic but completely ambulatory  PHYSICAL EXAM: Most Recent Vital Signs: unknown if currently breastfeeding. BP 123/89 (BP Location: Right Arm, Patient Position: Sitting)   Temp 98.4 F (36.9 C) (Oral)   Resp 18   Wt 154 lb 12.8 oz (70.2 kg)   SpO2 100%   BMI 24.25 kg/m   General Appearance:    Alert, cooperative, no distress, appears stated age  Head:    Normocephalic, without obvious abnormality, atraumatic  Eyes:    PERRL, conjunctiva/corneas clear, EOM's intact, fundi    benign, both eyes  Ears:    Normal TM's and external ear canals, both ears  Nose:   Nares normal, septum midline, mucosa normal, no drainage    or sinus tenderness  Throat:   Lips, mucosa, and tongue normal; teeth and gums normal  Neck:   Supple, symmetrical, trachea midline, no adenopathy;    thyroid:  no enlargement/tenderness/nodules; no carotid   bruit or JVD  Back:     Symmetric, no curvature, ROM normal, no CVA tenderness  Lungs:     Clear to auscultation bilaterally, respirations unlabored  Chest Wall:    No tenderness or deformity   Heart:    Regular rate and rhythm, S1 and S2 normal, no murmur, rub   or gallop  Breast Exam:    No tenderness, masses, or nipple abnormality  Abdomen:     Soft, non-tender, bowel sounds active all four quadrants,    no masses, no organomegaly  Genitalia:    Normal female without lesion, discharge or tenderness  Rectal:    Normal tone, normal prostate, no masses or tenderness;   guaiac negative stool  Extremities:   Extremities normal, atraumatic, no cyanosis or edema  Pulses:   2+ and symmetric all extremities  Skin:   Skin color, texture, turgor normal, no rashes or lesions  Lymph nodes:   Cervical, supraclavicular, and axillary nodes normal  Neurologic:    CNII-XII intact, normal strength, sensation and reflexes    throughout    LABORATORY DATA:  Results for orders placed or performed in visit on 05/27/21 (from the past 48 hour(s))  CBC with Differential (Cancer Center Only)     Status: Abnormal   Collection Time: 05/27/21  8:44 AM  Result Value Ref Range   WBC Count 5.1 4.0 - 10.5 K/uL   RBC 4.06 3.87 - 5.11 MIL/uL   Hemoglobin 8.6 (L) 12.0 - 15.0 g/dL    Comment: Reticulocyte Hemoglobin testing may be clinically indicated, consider ordering this additional test [id number]    HCT 29.2 (L) 36.0 - 46.0 %   MCV 71.9 (L) 80.0 - 100.0 fL   MCH 21.2 (L) 26.0 -  34.0 pg   MCHC 29.5 (L) 30.0 - 36.0 g/dL   RDW 18.9 (H) 11.5 - 15.5 %   Platelet Count 360 150 - 400 K/uL   nRBC 0.0 0.0 - 0.2 %   Neutrophils Relative % 51 %   Neutro Abs 2.6 1.7 - 7.7 K/uL   Lymphocytes Relative 38 %   Lymphs Abs 1.9 0.7 - 4.0 K/uL   Monocytes Relative 8 %   Monocytes Absolute 0.4 0.1 - 1.0 K/uL   Eosinophils Relative 2 %   Eosinophils Absolute 0.1 0.0 - 0.5 K/uL   Basophils Relative 0 %   Basophils Absolute 0.0 0.0 - 0.1 K/uL   Immature Granulocytes 1 %   Abs Immature Granulocytes 0.04 0.00 - 0.07 K/uL    Comment: Performed at [facility] at [facility], [address] [place], [place] [address]  Reticulocytes     Status: Abnormal   Collection Time: 05/27/21  8:45 AM  Result Value Ref Range   Retic Ct Pct 1.6 0.4 - 3.1 %   RBC. 4.00 3.87 - 5.11 MIL/uL   Retic Count, Absolute 62.0 19.0 - 186.0 K/uL   Immature Retic Fract 26.6 (H) 2.3 - 15.9 %    Comment: Performed at [facility] at [facility], [address], [place], [place] [address]      RADIOGRAPHY: No results found.     PATHOLOGY: None   ASSESSMENT/PLAN: Ms. Woodstock is a very pleasant 45 yo female with a large uterine fibroid and heavy, irregular cycle. She is scheduled for hysterectomy this month on 6/13.  Hgb is slightly improved at  8.6, MCV 71%, platelets 360 and WBC count 5.1.  We will get her set up for 2 doses of IV iron.  Alpha thalassemia and Hgb fractionation cascade testing pending. She may also benefit from folic acid.  Follow-up in 6 weeks.   All questions were answered. The patient knows to call the clinic with any problems, questions or concerns. We can certainly see the patient much sooner if necessary.   Laverna Peace, NP

## 2021-05-28 LAB — ERYTHROPOIETIN: Erythropoietin: 80.6 m[IU]/mL — ABNORMAL HIGH (ref 2.6–18.5)

## 2021-05-30 ENCOUNTER — Telehealth: Payer: Self-pay | Admitting: *Deleted

## 2021-05-30 ENCOUNTER — Other Ambulatory Visit: Payer: Self-pay | Admitting: Obstetrics and Gynecology

## 2021-05-30 LAB — HGB FRACTIONATION CASCADE
Hgb A2: 2 % (ref 1.8–3.2)
Hgb A: 98 % (ref 96.4–98.8)
Hgb F: 0 % (ref 0.0–2.0)
Hgb S: 0 %

## 2021-05-30 NOTE — Telephone Encounter (Signed)
Per scheduling message 05/27/21 from sarah - called and lvm of upcoming appointmentments

## 2021-05-31 ENCOUNTER — Telehealth: Payer: Self-pay | Admitting: *Deleted

## 2021-05-31 ENCOUNTER — Other Ambulatory Visit: Payer: Self-pay

## 2021-05-31 ENCOUNTER — Inpatient Hospital Stay: Payer: 59

## 2021-05-31 VITALS — BP 114/61 | HR 105 | Temp 98.5°F | Resp 18

## 2021-05-31 DIAGNOSIS — D500 Iron deficiency anemia secondary to blood loss (chronic): Secondary | ICD-10-CM

## 2021-05-31 MED ORDER — SODIUM CHLORIDE 0.9% FLUSH
3.0000 mL | Freq: Once | INTRAVENOUS | Status: DC | PRN
  Filled 2021-05-31: qty 10

## 2021-05-31 MED ORDER — SODIUM CHLORIDE 0.9 % IV SOLN
Freq: Once | INTRAVENOUS | Status: AC
  Filled 2021-05-31: qty 250

## 2021-05-31 MED ORDER — SODIUM CHLORIDE 0.9% FLUSH
10.0000 mL | Freq: Once | INTRAVENOUS | Status: DC | PRN
  Filled 2021-05-31: qty 10

## 2021-05-31 MED ORDER — SODIUM CHLORIDE 0.9 % IV SOLN
125.0000 mg | Freq: Once | INTRAVENOUS | Status: AC
  Administered 2021-05-31: 125 mg via INTRAVENOUS
  Filled 2021-05-31: qty 125

## 2021-05-31 NOTE — Telephone Encounter (Signed)
Per 05/27/21 los - gave patient upcoming appointment - view mychart

## 2021-05-31 NOTE — Patient Instructions (Signed)
Sodium Ferric Gluconate Complex injection What is this medicine? SODIUM FERRIC GLUCONATE COMPLEX (SOE dee um FER ik GLOO koe nate KOM pleks) is an iron replacement. It is used with epoetin therapy to treat low iron levels in patients who are receiving hemodialysis. This medicine may be used for other purposes; ask your health care provider or pharmacist if you have questions. COMMON BRAND NAME(S): Ferrlecit, Nulecit What should I tell my health care provider before I take this medicine? They need to know if you have any of the following conditions:  anemia that is not from iron deficiency  high levels of iron in the body  an unusual or allergic reaction to iron, benzyl alcohol, other medicines, foods, dyes, or preservatives  pregnant or are trying to become pregnant  breast-feeding How should I use this medicine? This medicine is for infusion into a vein. It is given by a health care professional in a hospital or clinic setting. Talk to your pediatrician regarding the use of this medicine in children. While this drug may be prescribed for children as young as 6 years old for selected conditions, precautions do apply. Overdosage: If you think you have taken too much of this medicine contact a poison control center or emergency room at once. NOTE: This medicine is only for you. Do not share this medicine with others. What if I miss a dose? It is important not to miss your dose. Call your doctor or health care professional if you are unable to keep an appointment. What may interact with this medicine? Do not take this medicine with any of the following medications:  deferoxamine  dimercaprol  other iron products This medicine may also interact with the following medications:  chloramphenicol  deferasirox  medicine for blood pressure like enalapril This list may not describe all possible interactions. Give your health care provider a list of all the medicines, herbs,  non-prescription drugs, or dietary supplements you use. Also tell them if you smoke, drink alcohol, or use illegal drugs. Some items may interact with your medicine. What should I watch for while using this medicine? Your condition will be monitored carefully while you are receiving this medicine. Visit your doctor for check-ups as directed. What side effects may I notice from receiving this medicine? Side effects that you should report to your doctor or health care professional as soon as possible:  allergic reactions like skin rash, itching or hives, swelling of the face, lips, or tongue  breathing problems  changes in hearing  changes in vision  chills, flushing, or sweating  fast, irregular heartbeat  feeling faint or lightheaded, falls  fever, flu-like symptoms  high or low blood pressure  pain, tingling, numbness in the hands or feet  severe pain in the chest, back, flanks, or groin  swelling of the ankles, feet, hands  trouble passing urine or change in the amount of urine  unusually weak or tired Side effects that usually do not require medical attention (report to your doctor or health care professional if they continue or are bothersome):  cramps  dark colored stools  diarrhea  headache  nausea, vomiting  stomach upset This list may not describe all possible side effects. Call your doctor for medical advice about side effects. You may report side effects to FDA at 1-800-FDA-1088. Where should I keep my medicine? This drug is given in a hospital or clinic and will not be stored at home. NOTE: This sheet is a summary. It may not cover all   possible information. If you have questions about this medicine, talk to your doctor, pharmacist, or health care provider.  2021 Elsevier/Gold Standard (2008-08-12 15:58:57)   

## 2021-06-01 ENCOUNTER — Other Ambulatory Visit: Payer: Self-pay

## 2021-06-01 ENCOUNTER — Ambulatory Visit: Payer: 59

## 2021-06-01 ENCOUNTER — Encounter (HOSPITAL_BASED_OUTPATIENT_CLINIC_OR_DEPARTMENT_OTHER): Payer: Self-pay | Admitting: Obstetrics and Gynecology

## 2021-06-01 DIAGNOSIS — D282 Benign neoplasm of uterine tubes and ligaments: Secondary | ICD-10-CM | POA: Diagnosis not present

## 2021-06-01 DIAGNOSIS — Z20822 Contact with and (suspected) exposure to covid-19: Secondary | ICD-10-CM | POA: Diagnosis not present

## 2021-06-01 DIAGNOSIS — N8501 Benign endometrial hyperplasia: Secondary | ICD-10-CM | POA: Diagnosis not present

## 2021-06-01 DIAGNOSIS — D259 Leiomyoma of uterus, unspecified: Secondary | ICD-10-CM | POA: Diagnosis not present

## 2021-06-01 DIAGNOSIS — N920 Excessive and frequent menstruation with regular cycle: Secondary | ICD-10-CM | POA: Diagnosis present

## 2021-06-01 NOTE — Progress Notes (Addendum)
Spoke w/ via phone for pre-op interview---pt Lab needs dos----   Urine preg            Lab results------has lab appt 06-02-2021 1300 for cbc bmp t & c 2 units COVID test -----06-02-2021 1500 (overnight stay) Arrive at -------1030 am 06-06-2021 NPO after MN NO Solid Food.  Clear liquids from MN until---930 am then npo Med rec completed Medications to take morning of surgery -----norethidrone Diabetic medication -----n/a Patient instructed no nail polish to be worn day of surgery Patient instructed to bring photo id and insurance card day of surgery Patient aware to have Driver (ride ) / caregiver driver home after spending night rons spouse    for 24 hours after surgery  Patient Special Instructions -----pt given overnight stay instructions Pre-Op special Istructions -----none Patient verbalized understanding of instructions that were given at this phone interview. Patient denies shortness of breath, chest pain, fever, cough at this phone interview.  Chest ct 12-22-2017 epic

## 2021-06-01 NOTE — Progress Notes (Signed)
YOU ARE SCHEDULED FOR A COVID TEST 06-02-2021 @ 300  PM. THIS TEST MUST BE DONE BEFORE SURGERY. GO TO  [facility] [place], [place], IT IS APPROXIMATELY 2 MINUTES PAST [employer] ON THE RIGHT AND REMAIN IN YOUR CAR, THIS IS A DRIVE UP TEST.       Your procedure is scheduled on 06-06-2021  Report to [facility]   Call this number if you have problems the morning of surgery  [phone].   OUR ADDRESS IS [address].  WE ARE LOCATED IN [facility].  PLEASE BRING YOUR INSURANCE CARD AND PHOTO ID DAY OF SURGERY.  ONLY ONE PERSON ALLOWED IN FACILITY WAITING AREA.                                     REMEMBER:  DO NOT EAT FOOD, CANDY GUM OR MINTS  AFTER MIDNIGHT . YOU MAY HAVE CLEAR LIQUIDS FROM MIDNIGHT UNTIL 930 AM. NO CLEAR LIQUIDS AFTER 930 AM DAY OF SURGERY.   YOU MAY  BRUSH YOUR TEETH MORNING OF SURGERY AND RINSE YOUR MOUTH OUT, NO CHEWING GUM CANDY OR MINTS.    CLEAR LIQUID DIET   Foods Allowed                                                                     Foods Excluded  Coffee and tea, regular and decaf                             liquids that you cannot  Plain Jell-O any favor except red or purple                                           see through such as: Fruit ices (not with fruit pulp)                                     milk, soups, orange juice  Iced Popsicles                                    All solid food Carbonated beverages, regular and diet                                    Cranberry, grape and apple juices Sports drinks like Gatorade Lightly seasoned clear broth or consume(fat free) Sugar, honey syrup  Sample Menu Breakfast                                Lunch  Supper Cranberry juice                    Beef broth                            Chicken broth Jell-O                                     Grape juice                           Apple  juice Coffee or tea                        Jell-O                                      Popsicle                                                Coffee or tea                        Coffee or tea  _____________________________________________________________________     TAKE THESE MEDICATIONS MORNING OF SURGERY WITH A SIP OF WATER:  NORETHINDRONE.  ONE VISITOR IS ALLOWED IN WAITING ROOM ONLY DAY OF SURGERY.  NO VISITOR MAY SPEND THE NIGHT.  VISITOR ARE ALLOWED TO STAY UNTIL 800 PM.                                    DO NOT WEAR JEWERLY, MAKE UP. DO NOT WEAR LOTIONS, POWDERS, PERFUMES OR NAIL POLISH. DO NOT SHAVE FOR 24 HOURS PRIOR TO DAY OF SURGERY. MEN MAY SHAVE FACE AND NECK. CONTACTS, GLASSES, OR DENTURES MAY NOT BE WORN TO SURGERY.                                    [facility] IS NOT RESPONSIBLE  FOR ANY BELONGINGS.                                                                    Marland Kitchen           [facility] - Preparing for Surgery Before surgery, you can play an important role.  Because skin is not sterile, your skin needs to be as free of germs as possible.  You can reduce the number of germs on your skin by washing with CHG (chlorahexidine gluconate) soap before surgery.  CHG is an antiseptic cleaner which kills germs and bonds with the skin to continue killing germs even after washing. Please DO NOT use if you have an allergy to CHG or antibacterial soaps.  If your skin becomes reddened/irritated  stop using the CHG and inform your nurse when you arrive at Short Stay. Do not shave (including legs and underarms) for at least 48 hours prior to the first CHG shower.  You may shave your face/neck. Please follow these instructions carefully:  1.  Shower with CHG Soap the night before surgery and the  morning of Surgery.  2.  If you choose to wash your hair, wash your hair first as usual with your  normal  shampoo.  3.  After you shampoo, rinse your hair and body thoroughly to remove the   shampoo.                            4.  Use CHG as you would any other liquid soap.  You can apply chg directly  to the skin and wash                      Gently with a scrungie or clean washcloth.  5.  Apply the CHG Soap to your body ONLY FROM THE NECK DOWN.   Do not use on face/ open                           Wound or open sores. Avoid contact with eyes, ears mouth and genitals (private parts).                       Wash face,  Genitals (private parts) with your normal soap.             6.  Wash thoroughly, paying special attention to the area where your surgery  will be performed.  7.  Thoroughly rinse your body with warm water from the neck down.  8.  DO NOT shower/wash with your normal soap after using and rinsing off  the CHG Soap.                9.  Pat yourself dry with a clean towel.            10.  Wear clean pajamas.            11.  Place clean sheets on your bed the night of your first shower and do not  sleep with pets. Day of Surgery : Do not apply any lotions/deodorants the morning of surgery.  Please wear clean clothes to the hospital/surgery center.  FAILURE TO FOLLOW THESE INSTRUCTIONS MAY RESULT IN THE CANCELLATION OF YOUR SURGERY PATIENT SIGNATURE_________________________________  NURSE SIGNATURE__________________________________  ________________________________________________________________________                                                        QUESTIONS Hansel Feinstein PRE OP NURSE PHONE [phone].

## 2021-06-02 ENCOUNTER — Encounter (HOSPITAL_COMMUNITY)
Admission: RE | Admit: 2021-06-02 | Discharge: 2021-06-02 | Disposition: A | Discharge status: Home / Self Care | Payer: 59 | Source: Ambulatory Visit | Attending: Obstetrics and Gynecology | Admitting: Obstetrics and Gynecology

## 2021-06-02 ENCOUNTER — Other Ambulatory Visit (HOSPITAL_COMMUNITY)
Admission: RE | Admit: 2021-06-02 | Discharge: 2021-06-02 | Disposition: A | Discharge status: Home / Self Care | Payer: 59 | Source: Ambulatory Visit | Attending: Obstetrics and Gynecology | Admitting: Obstetrics and Gynecology

## 2021-06-02 DIAGNOSIS — Z20822 Contact with and (suspected) exposure to covid-19: Secondary | ICD-10-CM | POA: Insufficient documentation

## 2021-06-02 DIAGNOSIS — Z01812 Encounter for preprocedural laboratory examination: Secondary | ICD-10-CM | POA: Insufficient documentation

## 2021-06-02 LAB — CBC
HCT: 28.4 % — ABNORMAL LOW (ref 36.0–46.0)
Hemoglobin: 8.2 g/dL — ABNORMAL LOW (ref 12.0–15.0)
MCH: 21.5 pg — ABNORMAL LOW (ref 26.0–34.0)
MCHC: 28.9 g/dL — ABNORMAL LOW (ref 30.0–36.0)
MCV: 74.5 fL — ABNORMAL LOW (ref 80.0–100.0)
Platelets: 337 10*3/uL (ref 150–400)
RBC: 3.81 MIL/uL — ABNORMAL LOW (ref 3.87–5.11)
RDW: 19.1 % — ABNORMAL HIGH (ref 11.5–15.5)
WBC: 6.4 10*3/uL (ref 4.0–10.5)
nRBC: 0.3 % — ABNORMAL HIGH (ref 0.0–0.2)

## 2021-06-02 LAB — BASIC METABOLIC PANEL
Anion gap: 8 (ref 5–15)
BUN: 7 mg/dL (ref 6–20)
CO2: 23 mmol/L (ref 22–32)
Calcium: 9 mg/dL (ref 8.9–10.3)
Chloride: 106 mmol/L (ref 98–111)
Creatinine, Ser: 0.84 mg/dL (ref 0.44–1.00)
GFR, Estimated: >60 mL/min (ref >60)
Glucose, Bld: 123 mg/dL — ABNORMAL HIGH (ref 70–99)
Potassium: 3.9 mmol/L (ref 3.5–5.1)
Sodium: 137 mmol/L (ref 135–145)

## 2021-06-02 LAB — SARS CORONAVIRUS 2 (TAT 6-24 HRS): SARS Coronavirus 2: NEGATIVE

## 2021-06-02 NOTE — Progress Notes (Signed)
Left message for stacy to notify dr Mancel Bale hemaglobin was 8.2 on pre op labs today and type and cross order needs to be clarified, does pt need blood before or after surgery?

## 2021-06-03 ENCOUNTER — Other Ambulatory Visit: Payer: Self-pay

## 2021-06-03 ENCOUNTER — Inpatient Hospital Stay: Payer: 59

## 2021-06-03 VITALS — BP 122/70 | HR 100 | Temp 98.3°F | Resp 20

## 2021-06-03 DIAGNOSIS — D500 Iron deficiency anemia secondary to blood loss (chronic): Secondary | ICD-10-CM | POA: Diagnosis not present

## 2021-06-03 MED ORDER — SODIUM CHLORIDE 0.9 % IV SOLN
Freq: Once | INTRAVENOUS | Status: AC
  Filled 2021-06-03: qty 250

## 2021-06-03 MED ORDER — NA FERRIC GLUC CPLX IN SUCROSE 12.5 MG/ML IV SOLN
125.0000 mg | Freq: Once | INTRAVENOUS | Status: AC
  Administered 2021-06-03: 125 mg via INTRAVENOUS
  Filled 2021-06-03: qty 125

## 2021-06-03 NOTE — Progress Notes (Signed)
Stacy from dr Mancel Bale office left message type and cross for 2 units is to be on hold in case patient needs  blood after surgery per dr Mancel Bale, dr Mancel Bale asked to cosign type and cross order.

## 2021-06-03 NOTE — Patient Instructions (Signed)
Sodium Ferric Gluconate Complex injection What is this medication? SODIUM FERRIC GLUCONATE COMPLEX (SOE dee um FER ik GLOO koe nate KOM pleks) is an iron replacement. It is used with epoetin therapy to treat low iron levelsin patients who are receiving hemodialysis. This medicine may be used for other purposes; ask your health care provider orpharmacist if you have questions. COMMON BRAND NAME(S): Ferrlecit, Nulecit What should I tell my care team before I take this medication? They need to know if you have any of the following conditions: anemia that is not from iron deficiency high levels of iron in the body an unusual or allergic reaction to iron, benzyl alcohol, other medicines, foods, dyes, or preservatives pregnant or are trying to become pregnant breast-feeding How should I use this medication? This medicine is for infusion into a vein. It is given by a health careprofessional in a hospital or clinic setting. Talk to your pediatrician regarding the use of this medicine in children. While this drug may be prescribed for children as young as 6 years old for selectedconditions, precautions do apply. Overdosage: If you think you have taken too much of this medicine contact apoison control center or emergency room at once. NOTE: This medicine is only for you. Do not share this medicine with others. What if I miss a dose? It is important not to miss your dose. Call your doctor or health careprofessional if you are unable to keep an appointment. What may interact with this medication? Do not take this medicine with any of the following medications: deferoxamine dimercaprol other iron products This medicine may also interact with the following medications: chloramphenicol deferasirox medicine for blood pressure like enalapril This list may not describe all possible interactions. Give your health care provider a list of all the medicines, herbs, non-prescription drugs, or dietary  supplements you use. Also tell them if you smoke, drink alcohol, or use illegaldrugs. Some items may interact with your medicine. What should I watch for while using this medication? Your condition will be monitored carefully while you are receiving thismedicine. Visit your doctor for check-ups as directed. What side effects may I notice from receiving this medication? Side effects that you should report to your doctor or health care professionalas soon as possible: allergic reactions like skin rash, itching or hives, swelling of the face, lips, or tongue breathing problems changes in hearing changes in vision chills, flushing, or sweating fast, irregular heartbeat feeling faint or lightheaded, falls fever, flu-like symptoms high or low blood pressure pain, tingling, numbness in the hands or feet severe pain in the chest, back, flanks, or groin swelling of the ankles, feet, hands trouble passing urine or change in the amount of urine unusually weak or tired Side effects that usually do not require medical attention (report to yourdoctor or health care professional if they continue or are bothersome): cramps dark colored stools diarrhea headache nausea, vomiting stomach upset This list may not describe all possible side effects. Call your doctor for medical advice about side effects. You may report side effects to FDA at1-800-FDA-1088. Where should I keep my medication? This drug is given in a hospital or clinic and will not be stored at home. NOTE: This sheet is a summary. It may not cover all possible information. If you have questions about this medicine, talk to your doctor, pharmacist, orhealth care provider.  2022 Elsevier/Gold Standard (2008-08-12 15:58:57)  

## 2021-06-06 ENCOUNTER — Observation Stay (HOSPITAL_BASED_OUTPATIENT_CLINIC_OR_DEPARTMENT_OTHER)
Admission: RE | Admit: 2021-06-06 | Discharge: 2021-06-08 | Disposition: A | Discharge status: Home / Self Care | Payer: 59 | Attending: Obstetrics and Gynecology | Admitting: Obstetrics and Gynecology

## 2021-06-06 ENCOUNTER — Encounter (HOSPITAL_BASED_OUTPATIENT_CLINIC_OR_DEPARTMENT_OTHER): Payer: Self-pay | Admitting: Obstetrics and Gynecology

## 2021-06-06 ENCOUNTER — Ambulatory Visit (HOSPITAL_BASED_OUTPATIENT_CLINIC_OR_DEPARTMENT_OTHER): Payer: 59 | Admitting: Anesthesiology

## 2021-06-06 ENCOUNTER — Encounter (HOSPITAL_COMMUNITY)
Admission: RE | Disposition: A | Discharge status: Home / Self Care | Payer: Self-pay | Source: Home / Self Care | Attending: Obstetrics and Gynecology

## 2021-06-06 DIAGNOSIS — D259 Leiomyoma of uterus, unspecified: Principal | ICD-10-CM | POA: Diagnosis present

## 2021-06-06 DIAGNOSIS — N8501 Benign endometrial hyperplasia: Secondary | ICD-10-CM | POA: Insufficient documentation

## 2021-06-06 DIAGNOSIS — D282 Benign neoplasm of uterine tubes and ligaments: Secondary | ICD-10-CM | POA: Insufficient documentation

## 2021-06-06 DIAGNOSIS — D219 Benign neoplasm of connective and other soft tissue, unspecified: Secondary | ICD-10-CM | POA: Diagnosis present

## 2021-06-06 DIAGNOSIS — Z20822 Contact with and (suspected) exposure to covid-19: Secondary | ICD-10-CM | POA: Insufficient documentation

## 2021-06-06 HISTORY — PX: VAGINAL HYSTERECTOMY: SHX2639

## 2021-06-06 HISTORY — DX: Anemia, unspecified: D64.9

## 2021-06-06 HISTORY — DX: Anxiety disorder, unspecified: F41.9

## 2021-06-06 HISTORY — PX: CYSTOSCOPY: SHX5120

## 2021-06-06 HISTORY — PX: BILATERAL SALPINGECTOMY: SHX5743

## 2021-06-06 LAB — POCT I-STAT, CHEM 8
BUN: 5 mg/dL — ABNORMAL LOW (ref 6–20)
Calcium, Ion: 0.99 mmol/L — ABNORMAL LOW (ref 1.15–1.40)
Chloride: 104 mmol/L (ref 98–111)
Creatinine, Ser: 0.6 mg/dL (ref 0.44–1.00)
Glucose, Bld: 119 mg/dL — ABNORMAL HIGH (ref 70–99)
HCT: 24 % — ABNORMAL LOW (ref 36.0–46.0)
Hemoglobin: 8.2 g/dL — ABNORMAL LOW (ref 12.0–15.0)
Potassium: 4.3 mmol/L (ref 3.5–5.1)
Sodium: 137 mmol/L (ref 135–145)
TCO2: 21 mmol/L — ABNORMAL LOW (ref 22–32)

## 2021-06-06 LAB — CBC
HCT: 27.4 % — ABNORMAL LOW (ref 36.0–46.0)
HCT: 23.8 % — ABNORMAL LOW (ref 36.0–46.0)
Hemoglobin: 8.1 g/dL — ABNORMAL LOW (ref 12.0–15.0)
Hemoglobin: 7.4 g/dL — ABNORMAL LOW (ref 12.0–15.0)
MCH: 23.7 pg — ABNORMAL LOW (ref 26.0–34.0)
MCH: 24 pg — ABNORMAL LOW (ref 26.0–34.0)
MCHC: 29.6 g/dL — ABNORMAL LOW (ref 30.0–36.0)
MCHC: 31.1 g/dL (ref 30.0–36.0)
MCV: 80.1 fL (ref 80.0–100.0)
MCV: 77.3 fL — ABNORMAL LOW (ref 80.0–100.0)
Platelets: 182 10*3/uL (ref 150–400)
Platelets: 206 10*3/uL (ref 150–400)
RBC: 3.42 MIL/uL — ABNORMAL LOW (ref 3.87–5.11)
RBC: 3.08 MIL/uL — ABNORMAL LOW (ref 3.87–5.11)
RDW: 18.9 % — ABNORMAL HIGH (ref 11.5–15.5)
RDW: 18.6 % — ABNORMAL HIGH (ref 11.5–15.5)
WBC: 11.9 10*3/uL — ABNORMAL HIGH (ref 4.0–10.5)
WBC: 10.2 10*3/uL (ref 4.0–10.5)
nRBC: 0 % (ref 0.0–0.2)
nRBC: 0 % (ref 0.0–0.2)

## 2021-06-06 LAB — RESP PANEL BY RT-PCR (FLU A&B, COVID) ARPGX2
Influenza A by PCR: NEGATIVE
Influenza B by PCR: NEGATIVE
SARS Coronavirus 2 by RT PCR: NEGATIVE

## 2021-06-06 LAB — POCT PREGNANCY, URINE: Preg Test, Ur: NEGATIVE

## 2021-06-06 SURGERY — HYSTERECTOMY, VAGINAL
Anesthesia: General | Site: Vagina

## 2021-06-06 MED ORDER — SUGAMMADEX SODIUM 200 MG/2ML IV SOLN
INTRAVENOUS | Status: DC | PRN
  Administered 2021-06-06: 150 mg via INTRAVENOUS

## 2021-06-06 MED ORDER — FENTANYL CITRATE (PF) 250 MCG/5ML IJ SOLN
INTRAMUSCULAR | Status: AC
  Filled 2021-06-06: qty 5

## 2021-06-06 MED ORDER — OXYCODONE HCL 5 MG PO TABS
5.0000 mg | ORAL_TABLET | Freq: Four times a day (QID) | ORAL | Status: DC | PRN
  Administered 2021-06-06 – 2021-06-07 (×3): 10 mg via ORAL
  Filled 2021-06-06 (×3): qty 2

## 2021-06-06 MED ORDER — OXYCODONE-ACETAMINOPHEN 5-325 MG PO TABS: 1.0000 | ORAL_TABLET | Freq: Four times a day (QID) | ORAL | Status: DC | PRN

## 2021-06-06 MED ORDER — LIDOCAINE HCL (PF) 2 % IJ SOLN
INTRAMUSCULAR | Status: AC
  Filled 2021-06-06: qty 15

## 2021-06-06 MED ORDER — LIDOCAINE 2% (20 MG/ML) 5 ML SYRINGE
INTRAMUSCULAR | Status: DC | PRN
  Administered 2021-06-06: 100 mg via INTRAVENOUS

## 2021-06-06 MED ORDER — POVIDONE-IODINE 10 % EX SWAB: 2.0000 "application " | Freq: Once | CUTANEOUS | Status: DC

## 2021-06-06 MED ORDER — ALBUMIN HUMAN 5 % IV SOLN
INTRAVENOUS | Status: AC
  Filled 2021-06-06: qty 250

## 2021-06-06 MED ORDER — DEXAMETHASONE SODIUM PHOSPHATE 10 MG/ML IJ SOLN
INTRAMUSCULAR | Status: DC | PRN
  Administered 2021-06-06: 10 mg via INTRAVENOUS

## 2021-06-06 MED ORDER — ONDANSETRON HCL 4 MG/2ML IJ SOLN
INTRAMUSCULAR | Status: DC | PRN
  Administered 2021-06-06: 4 mg via INTRAVENOUS

## 2021-06-06 MED ORDER — ROCURONIUM BROMIDE 10 MG/ML (PF) SYRINGE
PREFILLED_SYRINGE | INTRAVENOUS | Status: AC
  Filled 2021-06-06: qty 10

## 2021-06-06 MED ORDER — KETAMINE HCL 50 MG/5ML IJ SOSY
PREFILLED_SYRINGE | INTRAMUSCULAR | Status: AC
  Filled 2021-06-06: qty 5

## 2021-06-06 MED ORDER — CEFAZOLIN SODIUM-DEXTROSE 2-4 GM/100ML-% IV SOLN
INTRAVENOUS | Status: AC
  Filled 2021-06-06: qty 100

## 2021-06-06 MED ORDER — SODIUM CHLORIDE (PF) 0.9 % IJ SOLN
INTRAMUSCULAR | Status: AC
  Filled 2021-06-06: qty 10

## 2021-06-06 MED ORDER — LIDOCAINE 2% (20 MG/ML) 5 ML SYRINGE
INTRAMUSCULAR | Status: DC | PRN
  Administered 2021-06-06: 1.5 mg/kg/h via INTRAVENOUS

## 2021-06-06 MED ORDER — CYCLOBENZAPRINE HCL 10 MG PO TABS
10.0000 mg | ORAL_TABLET | Freq: Two times a day (BID) | ORAL | Status: DC
  Administered 2021-06-07 – 2021-06-08 (×3): 10 mg via ORAL
  Filled 2021-06-06 (×3): qty 1

## 2021-06-06 MED ORDER — HYDROMORPHONE HCL 1 MG/ML IJ SOLN
1.0000 mg | INTRAMUSCULAR | Status: DC | PRN
Start: 2021-06-06 — End: 2021-06-08
  Administered 2021-06-07: 1 mg via INTRAVENOUS
  Filled 2021-06-06: qty 1

## 2021-06-06 MED ORDER — VASOPRESSIN 20 UNIT/ML IV SOLN
INTRAVENOUS | Status: DC | PRN
  Administered 2021-06-06: 6 mL via INTRAMUSCULAR

## 2021-06-06 MED ORDER — FENTANYL CITRATE (PF) 100 MCG/2ML IJ SOLN
INTRAMUSCULAR | Status: DC | PRN
  Administered 2021-06-06 (×3): 50 ug via INTRAVENOUS
  Administered 2021-06-06: 100 ug via INTRAVENOUS
  Administered 2021-06-06 (×2): 50 ug via INTRAVENOUS

## 2021-06-06 MED ORDER — ACETAMINOPHEN 500 MG PO TABS
ORAL_TABLET | ORAL | Status: AC
  Filled 2021-06-06: qty 2

## 2021-06-06 MED ORDER — LACTATED RINGERS IV SOLN: INTRAVENOUS | Status: DC

## 2021-06-06 MED ORDER — ARTIFICIAL TEARS OPHTHALMIC OINT
TOPICAL_OINTMENT | OPHTHALMIC | Status: AC
  Filled 2021-06-06: qty 3.5

## 2021-06-06 MED ORDER — HYDROMORPHONE HCL 1 MG/ML IJ SOLN
1.0000 mg | Freq: Once | INTRAMUSCULAR | Status: AC
  Administered 2021-06-06: 1 mg via INTRAVENOUS
  Filled 2021-06-06: qty 1

## 2021-06-06 MED ORDER — ONDANSETRON HCL 4 MG/2ML IJ SOLN
INTRAMUSCULAR | Status: AC
  Filled 2021-06-06: qty 2

## 2021-06-06 MED ORDER — HYDROMORPHONE HCL 1 MG/ML IJ SOLN
INTRAMUSCULAR | Status: AC
  Filled 2021-06-06: qty 1

## 2021-06-06 MED ORDER — LIDOCAINE 2% (20 MG/ML) 5 ML SYRINGE: INTRAMUSCULAR | Status: DC | PRN

## 2021-06-06 MED ORDER — CEFAZOLIN SODIUM 1 G IJ SOLR
INTRAMUSCULAR | Status: AC
  Filled 2021-06-06: qty 20

## 2021-06-06 MED ORDER — FENTANYL CITRATE (PF) 100 MCG/2ML IJ SOLN
25.0000 ug | INTRAMUSCULAR | Status: DC | PRN
  Administered 2021-06-06 (×2): 50 ug via INTRAVENOUS

## 2021-06-06 MED ORDER — KETOROLAC TROMETHAMINE 30 MG/ML IJ SOLN
INTRAMUSCULAR | Status: AC
  Filled 2021-06-06: qty 1

## 2021-06-06 MED ORDER — SODIUM CHLORIDE 0.9 % IR SOLN
Status: DC | PRN
  Administered 2021-06-06: 1000 mL via INTRAVESICAL

## 2021-06-06 MED ORDER — KETAMINE HCL 10 MG/ML IJ SOLN
INTRAMUSCULAR | Status: DC | PRN
  Administered 2021-06-06: 10 mg via INTRAVENOUS
  Administered 2021-06-06: 30 mg via INTRAVENOUS
  Administered 2021-06-06: 10 mg via INTRAVENOUS

## 2021-06-06 MED ORDER — MIDAZOLAM HCL 2 MG/2ML IJ SOLN
INTRAMUSCULAR | Status: DC | PRN
  Administered 2021-06-06: 2 mg via INTRAVENOUS

## 2021-06-06 MED ORDER — LIDOCAINE HCL (PF) 2 % IJ SOLN
INTRAMUSCULAR | Status: AC
  Filled 2021-06-06: qty 5

## 2021-06-06 MED ORDER — PHENYLEPHRINE 40 MCG/ML (10ML) SYRINGE FOR IV PUSH (FOR BLOOD PRESSURE SUPPORT)
PREFILLED_SYRINGE | INTRAVENOUS | Status: AC
  Filled 2021-06-06: qty 10

## 2021-06-06 MED ORDER — SODIUM CHLORIDE 0.9 % IV SOLN: INTRAVENOUS | Status: DC | PRN

## 2021-06-06 MED ORDER — ROCURONIUM BROMIDE 10 MG/ML (PF) SYRINGE
PREFILLED_SYRINGE | INTRAVENOUS | Status: DC | PRN
  Administered 2021-06-06: 20 mg via INTRAVENOUS
  Administered 2021-06-06: 60 mg via INTRAVENOUS
  Administered 2021-06-06 (×3): 20 mg via INTRAVENOUS

## 2021-06-06 MED ORDER — KETOROLAC TROMETHAMINE 30 MG/ML IJ SOLN
INTRAMUSCULAR | Status: DC | PRN
  Administered 2021-06-06: 30 mg via INTRAVENOUS

## 2021-06-06 MED ORDER — HYDROMORPHONE HCL 1 MG/ML IJ SOLN
0.5000 mg | INTRAMUSCULAR | Status: DC | PRN
  Administered 2021-06-06 (×2): 0.5 mg via INTRAVENOUS

## 2021-06-06 MED ORDER — PROPOFOL 10 MG/ML IV BOLUS
INTRAVENOUS | Status: AC
  Filled 2021-06-06: qty 20

## 2021-06-06 MED ORDER — FENTANYL CITRATE (PF) 100 MCG/2ML IJ SOLN
INTRAMUSCULAR | Status: AC
  Filled 2021-06-06: qty 2

## 2021-06-06 MED ORDER — CEFAZOLIN SODIUM-DEXTROSE 2-4 GM/100ML-% IV SOLN
2.0000 g | INTRAVENOUS | Status: AC
  Administered 2021-06-06 (×2): 2 g via INTRAVENOUS

## 2021-06-06 MED ORDER — PHENYLEPHRINE HCL (PRESSORS) 10 MG/ML IV SOLN
INTRAVENOUS | Status: DC | PRN
  Administered 2021-06-06 (×3): 80 ug via INTRAVENOUS

## 2021-06-06 MED ORDER — PROPOFOL 10 MG/ML IV BOLUS
INTRAVENOUS | Status: DC | PRN
  Administered 2021-06-06: 150 mg via INTRAVENOUS

## 2021-06-06 MED ORDER — MIDAZOLAM HCL 2 MG/2ML IJ SOLN
INTRAMUSCULAR | Status: AC
  Filled 2021-06-06: qty 2

## 2021-06-06 MED ORDER — DEXAMETHASONE SODIUM PHOSPHATE 10 MG/ML IJ SOLN
INTRAMUSCULAR | Status: AC
  Filled 2021-06-06: qty 1

## 2021-06-06 MED ORDER — ACETAMINOPHEN 500 MG PO TABS
1000.0000 mg | ORAL_TABLET | Freq: Once | ORAL | Status: AC
  Administered 2021-06-06: 1000 mg via ORAL

## 2021-06-06 MED ORDER — IBUPROFEN 400 MG PO TABS
600.0000 mg | ORAL_TABLET | Freq: Four times a day (QID) | ORAL | Status: DC | PRN
  Administered 2021-06-06 – 2021-06-07 (×2): 600 mg via ORAL
  Filled 2021-06-06 (×2): qty 1

## 2021-06-06 MED ORDER — ACETAMINOPHEN 500 MG PO TABS
1000.0000 mg | ORAL_TABLET | Freq: Four times a day (QID) | ORAL | Status: AC
  Administered 2021-06-06 – 2021-06-08 (×5): 1000 mg via ORAL
  Filled 2021-06-06 (×6): qty 2

## 2021-06-06 MED ORDER — MENTHOL 3 MG MT LOZG: 1.0000 | LOZENGE | OROMUCOSAL | Status: DC | PRN

## 2021-06-06 MED ORDER — ALBUMIN HUMAN 5 % IV SOLN: INTRAVENOUS | Status: DC | PRN

## 2021-06-06 MED ORDER — PHENYLEPHRINE HCL-NACL 10-0.9 MG/250ML-% IV SOLN
INTRAVENOUS | Status: DC | PRN
  Administered 2021-06-06: 50 ug/min via INTRAVENOUS

## 2021-06-06 SURGICAL SUPPLY — 39 items
APL PRP STRL LF DISP 70% ISPRP (MISCELLANEOUS) ×4
CHLORAPREP W/TINT 26 (MISCELLANEOUS) ×3 IMPLANT
COVER WAND RF STERILE (DRAPES) ×6 IMPLANT
DECANTER SPIKE VIAL GLASS SM (MISCELLANEOUS) ×3 IMPLANT
DRAPE SHEET LG 3/4 BI-LAMINATE (DRAPES) ×6 IMPLANT
DRAPE STERI URO 9X17 APER PCH (DRAPES) ×6 IMPLANT
GAUZE SPONGE 4X4 12PLY STRL LF (GAUZE/BANDAGES/DRESSINGS) ×6 IMPLANT
GLOVE SURG ENC MOIS LTX SZ7.5 (GLOVE) ×6 IMPLANT
GLOVE SURG UNDER POLY LF SZ6.5 (GLOVE) ×15 IMPLANT
GLOVE SURG UNDER POLY LF SZ7 (GLOVE) ×15 IMPLANT
GLOVE SURG UNDER POLY LF SZ7.5 (GLOVE) ×12 IMPLANT
GOWN STRL REUS W/ TWL LRG LVL3 (GOWN DISPOSABLE) ×2 IMPLANT
GOWN STRL REUS W/TWL LRG LVL3 (GOWN DISPOSABLE) ×42 IMPLANT
HOLDER FOLEY CATH W/STRAP (MISCELLANEOUS) ×3 IMPLANT
KIT TURNOVER CYSTO (KITS) ×6 IMPLANT
NDL MAYO CATGUT SZ4 TPR NDL (NEEDLE) IMPLANT
NEEDLE HYPO 22GX1.5 SAFETY (NEEDLE) ×6 IMPLANT
NEEDLE MAYO CATGUT SZ4 (NEEDLE) IMPLANT
NS IRRIG 1000ML POUR BTL (IV SOLUTION) ×3 IMPLANT
NS IRRIG 500ML POUR BTL (IV SOLUTION) ×6 IMPLANT
PACK VAGINAL WOMENS (CUSTOM PROCEDURE TRAY) ×6 IMPLANT
PAD OB MATERNITY 4.3X12.25 (PERSONAL CARE ITEMS) ×6 IMPLANT
RETRACTOR YANK SUCT EIGR SABER (INSTRUMENTS) ×3 IMPLANT
SET IRRIG Y TYPE TUR BLADDER L (SET/KITS/TRAYS/PACK) ×6 IMPLANT
SHEET LAVH (DRAPES) ×3 IMPLANT
SPONGE LAP 18X18 RF (DISPOSABLE) ×3 IMPLANT
SUT VIC AB 0 CT1 18XCR BRD8 (SUTURE) ×20 IMPLANT
SUT VIC AB 0 CT1 27 (SUTURE) ×6
SUT VIC AB 0 CT1 27XBRD ANBCTR (SUTURE) ×1 IMPLANT
SUT VIC AB 0 CT1 8-18 (SUTURE) ×66
SUT VIC AB 2-0 SH 27 (SUTURE) ×36
SUT VIC AB 2-0 SH 27XBRD (SUTURE) ×6 IMPLANT
SUT VICRYL 0 TIES 12 18 (SUTURE) ×6 IMPLANT
SYR BULB IRRIG 60ML STRL (SYRINGE) ×6 IMPLANT
SYR TB 1ML LL NO SAFETY (SYRINGE) ×6 IMPLANT
TOWEL OR 17X26 10 PK STRL BLUE (TOWEL DISPOSABLE) ×12 IMPLANT
TRAY FOLEY W/BAG SLVR 14FR (SET/KITS/TRAYS/PACK) ×6 IMPLANT
TUBING SUCTION 1/4X6FT (MISCELLANEOUS) ×3 IMPLANT
UNDERPAD 30X36 HEAVY ABSORB (UNDERPADS AND DIAPERS) ×6 IMPLANT

## 2021-06-06 NOTE — Discharge Instructions (Addendum)
Call [facility] @ [phone] if:  You have a temperature greater than or equal to 100.4 degrees Farenheit orally You have pain that is not made better by the pain medication given and taken as directed You have excessive bleeding or problems urinating  Take Colace (Docusate Sodium/Stool Softener) 100 mg 2-3 times daily while taking narcotic pain medicine to avoid constipation or until bowel movements are regular. If you have not had a good bowel movement in 3 days,  take a laxative of your choice or Miralax as directed.  Begin in two weeks, oral iron of your choice, every other day  Take Ibuprofen 600 mg with food every 6 hours for 5 days  Take your Oxycodone as directed for breakthrough pain  You may drive after 2  weeks You may walk up steps  You may shower tomorrow You may resume a regular diet  Keep incisions clean and dry Do not lift over 15 pounds for 6 weeks Avoid anything in vagina for 6 weeks (or until after your post-operative visit)

## 2021-06-06 NOTE — Anesthesia Procedure Notes (Signed)
Procedure Name: Intubation Date/Time: 06/06/2021 12:53 PM Performed by: Mechele Claude, CRNA Pre-anesthesia Checklist: Patient identified, Emergency Drugs available, Suction available and Patient being monitored Patient Re-evaluated:Patient Re-evaluated prior to induction Oxygen Delivery Method: Circle system utilized Preoxygenation: Pre-oxygenation with 100% oxygen Induction Type: IV induction Ventilation: Mask ventilation without difficulty Laryngoscope Size: Mac and 3 Grade View: Grade I Tube type: Oral Tube size: 7.0 mm Number of attempts: 1 Airway Equipment and Method: Stylet and Oral airway Placement Confirmation: ETT inserted through vocal cords under direct vision, positive ETCO2 and breath sounds checked- equal and bilateral Tube secured with: Tape Dental Injury: Teeth and Oropharynx as per pre-operative assessment

## 2021-06-06 NOTE — Op Note (Signed)
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   Pathology: Large fibroid uterus, cervix (wt 852 g) and bilateral fallopian tubes  Fluids: 1.3500 cc crystalloid 2.1L albumin 3.2u PRBCs  UOP: 900 cc  EBL: 4967 cc  Complications: None  Procedure:  The patient was taken to the operating room after the risks, benefits and alternatives were discussed with the patient. The patient verbalized understanding and consent signed and witnessed. The patient was placed under general anesthesia per the anesthesiologist and a timeout was performed per protocol. The patient was prepped and draped in the normal sterile fashion in the dorsal lithotomy position.  A weighted speculum was placed the patient's vagina and the anterior lip of the cervix was grasped with a single-tooth tenaculum and Dever retractors were placed for vaginal wall retraction. The cervix was circumscribed with the bovie after injecting the cervix with pitressin at a concentration of 20 units of Pitressin in 100 cc of normal saline.  Once the cervix was circumscribed the anterior cul-de-sac was entered without difficulty.  The posterior cul-de-sac was entered without difficulty as well.  Curved Heaney clamps were used to clamp the uterosacral and cardinal ligaments and the tissue was then cut and suture ligated using 0 Vicryl. This was done bilaterally and sequentially. The parametrial tissue was clamped, cut and suture ligated using 0 Vicryl in a sequential and bilateral fashion up to  the level of the large fibroid.  An 18x18 laparotomy sponge was placed posteriorly as the bowel continuously was pushing outward. The cervix was amputated and the fibroid was morcellated in small pieces in a coring fashion.  The fibroid was friable to the extent that clamps could not hold on to the tissue without pulling through so the fibroid was excised in sequential pieces until the bulk of it was removed and the right cornua was reached.  The right utero-ovarian ligament was then clamped cut and doubly suture ligated with 0 vicryl.   The serosa was held with clamps circumferentially incorporating the fundus and the fibroid continued to be morcellated until the left cornua was reached and the left utero-ovarian ligament was clamped cut and doubly suture ligated with 0 vicryl.  The remaining uterus was handed off and sent to pathology. The bilateral ovaries and fallopian tubes appeared to be within normal limits.  The right fallopian tube was grasped with a babcock and clamped beneath it with a large kelly clamp excised and doubly suture ligated with 2-0 vicryl and he pedicle cauterized.  The same was done on the contralateral side.  Copious irrigation was performed.  All pedicles were noted to be hemostatic.  A McCall Culdoplasty stitch was placed and the angles of the cuff were sutured using 0 vicryl.  The cuff was then repaired to the midline with figure-of-eight and interrupted stitches of 0 Vicryl.  The cuff was noted to be hemostatic and McCall Culdoplasty stitch tied prior to closure of the last stitches of the cuff.    The patient tolerated the procedure well and was returned to the recovery room in good condition.   I was present and scrubbed and the assistant was required due to complexity  of anatomy.

## 2021-06-06 NOTE — Anesthesia Preprocedure Evaluation (Addendum)
Anesthesia Evaluation  Patient identified by MRN, date of birth, ID band Patient awake    Reviewed: Allergy & Precautions, NPO status , Patient's Chart, lab work & pertinent test results  Airway Mallampati: I  TM Distance: >3 FB Neck ROM: Full    Dental no notable dental hx. (+) Teeth Intact, Dental Advisory Given   Pulmonary neg pulmonary ROS,    Pulmonary exam normal breath sounds clear to auscultation       Cardiovascular Normal cardiovascular exam Rhythm:Regular Rate:Normal  HLD   Neuro/Psych PSYCHIATRIC DISORDERS Anxiety negative neurological ROS     GI/Hepatic negative GI ROS, Neg liver ROS,   Endo/Other  negative endocrine ROS  Renal/GU negative Renal ROS  negative genitourinary   Musculoskeletal negative musculoskeletal ROS (+)   Abdominal   Peds  Hematology  (+) Blood dyscrasia (Hgb 8.2), anemia ,   Anesthesia Other Findings   Reproductive/Obstetrics                            Anesthesia Physical Anesthesia Plan  ASA: 2  Anesthesia Plan: General   Post-op Pain Management:    Induction: Intravenous  PONV Risk Score and Plan: 3 and Midazolam, Dexamethasone and Ondansetron  Airway Management Planned: Oral ETT  Additional Equipment:   Intra-op Plan:   Post-operative Plan: Extubation in OR  Informed Consent: I have reviewed the patients History and Physical, chart, labs and discussed the procedure including the risks, benefits and alternatives for the proposed anesthesia with the patient or authorized representative who has indicated his/her understanding and acceptance.     Dental advisory given  Plan Discussed with: CRNA  Anesthesia Plan Comments: (2 IVs, ketamine for adjunct analgesia)        Anesthesia Quick Evaluation

## 2021-06-06 NOTE — Plan of Care (Signed)
POA initiated

## 2021-06-06 NOTE — Addendum Note (Signed)
Addendum  created 06/06/21 1914 by Lidia Collum, MD   Clinical Note Signed

## 2021-06-06 NOTE — Anesthesia Postprocedure Evaluation (Addendum)
Anesthesia Post Note  Patient: Glori Machnik  Procedure(s) Performed: HYSTERECTOMY VAGINAL (Vagina ) OPEN BILATERAL SALPINGECTOMY, VAGINAL (Bilateral: Vagina ) CYSTOSCOPY (Bladder)     Patient location during evaluation: PACU Anesthesia Type: General Level of consciousness: awake and alert Pain management: pain level controlled Vital Signs Assessment: post-procedure vital signs reviewed and stable Respiratory status: spontaneous breathing, nonlabored ventilation and respiratory function stable Cardiovascular status: blood pressure returned to baseline and stable Postop Assessment: no apparent nausea or vomiting Anesthetic complications: no Comments:   Patient required transfusion of 2 units PRBC intraop and had significant blood loss. She required low dose phenylephrine during the case, but was weaned off immediately upon arrival to PACU. In PACU she is awake, alert, with stable blood pressure and heart rate at her baseline. She has no complaints other than expected surgical pain. We attempted to check iStat in PACU to guide decision to possibly transfuse another unit PRBC prior to transfer, however we had two failed samples. Considering her stable condition and no need for supportive blood pressure interventions in PACU I elected to forgo the iSTAT. We have sent a postop CBC which can be used to guide transfusion on the floor.   No notable events documented.  Last Vitals:  Vitals:   06/06/21 1137  BP: 140/90  Pulse: 98  Resp: 16  Temp: 37.9 C  SpO2: 100%    Last Pain:  Vitals:   06/06/21 1137  TempSrc: Oral  PainSc: 0-No pain                 Lidia Collum

## 2021-06-06 NOTE — Transfer of Care (Signed)
Immediate Anesthesia Transfer of Care Note  Patient: Raven Harper  Procedure(s) Performed: Procedure(s) (LRB): HYSTERECTOMY VAGINAL (N/A) OPEN BILATERAL SALPINGECTOMY, VAGINAL (Bilateral) CYSTOSCOPY (N/A)  Patient Location: PACU  Anesthesia Type: General  Level of Consciousness: awake, alert  and oriented  Airway & Oxygen Therapy: Patient Spontanous Breathing and Patient connected to face mask oxygen  Post-op Assessment: Report given to PACU RN and Post -op Vital signs reviewed and stable  Post vital signs: Reviewed and stable  Complications: No apparent anesthesia complications  Last Vitals:  Vitals Value Taken Time  BP 120/51 06/06/21 1830  Temp    Pulse 99 06/06/21 1836  Resp 18 06/06/21 1836  SpO2 100 % 06/06/21 1836  Vitals shown include unvalidated device data.  Last Pain:  Vitals:   06/06/21 1137  TempSrc: Oral  PainSc: 0-No pain      Patients Stated Pain Goal: 5 (51/76/16 0737)  Complications: No notable events documented.

## 2021-06-07 ENCOUNTER — Other Ambulatory Visit: Payer: Self-pay

## 2021-06-07 ENCOUNTER — Encounter (HOSPITAL_BASED_OUTPATIENT_CLINIC_OR_DEPARTMENT_OTHER): Payer: Self-pay | Admitting: Obstetrics and Gynecology

## 2021-06-07 DIAGNOSIS — D259 Leiomyoma of uterus, unspecified: Secondary | ICD-10-CM | POA: Diagnosis not present

## 2021-06-07 LAB — CBC WITH DIFFERENTIAL/PLATELET
Abs Immature Granulocytes: 0.08 10*3/uL — ABNORMAL HIGH (ref 0.00–0.07)
Basophils Absolute: 0 10*3/uL (ref 0.0–0.1)
Basophils Relative: 0 %
Eosinophils Absolute: 0 10*3/uL (ref 0.0–0.5)
Eosinophils Relative: 0 %
HCT: 25.4 % — ABNORMAL LOW (ref 36.0–46.0)
Hemoglobin: 8.3 g/dL — ABNORMAL LOW (ref 12.0–15.0)
Immature Granulocytes: 1 %
Lymphocytes Relative: 8 %
Lymphs Abs: 0.9 10*3/uL (ref 0.7–4.0)
MCH: 25.5 pg — ABNORMAL LOW (ref 26.0–34.0)
MCHC: 32.7 g/dL (ref 30.0–36.0)
MCV: 77.9 fL — ABNORMAL LOW (ref 80.0–100.0)
Monocytes Absolute: 0.9 10*3/uL (ref 0.1–1.0)
Monocytes Relative: 7 %
Neutro Abs: 10.4 10*3/uL — ABNORMAL HIGH (ref 1.7–7.7)
Neutrophils Relative %: 84 %
Platelets: 208 10*3/uL (ref 150–400)
RBC: 3.26 MIL/uL — ABNORMAL LOW (ref 3.87–5.11)
RDW: 19 % — ABNORMAL HIGH (ref 11.5–15.5)
WBC: 12.3 10*3/uL — ABNORMAL HIGH (ref 4.0–10.5)
nRBC: 0 % (ref 0.0–0.2)

## 2021-06-07 LAB — BASIC METABOLIC PANEL
Anion gap: 5 (ref 5–15)
BUN: 7 mg/dL (ref 6–20)
CO2: 25 mmol/L (ref 22–32)
Calcium: 8.8 mg/dL — ABNORMAL LOW (ref 8.9–10.3)
Chloride: 107 mmol/L (ref 98–111)
Creatinine, Ser: 0.68 mg/dL (ref 0.44–1.00)
GFR, Estimated: >60 mL/min (ref >60)
Glucose, Bld: 190 mg/dL — ABNORMAL HIGH (ref 70–99)
Potassium: 3.8 mmol/L (ref 3.5–5.1)
Sodium: 137 mmol/L (ref 135–145)

## 2021-06-07 LAB — ALPHA-THALASSEMIA GENOTYPR

## 2021-06-07 LAB — PREPARE RBC (CROSSMATCH)

## 2021-06-07 MED ORDER — DIPHENHYDRAMINE HCL 50 MG/ML IJ SOLN
12.5000 mg | Freq: Once | INTRAMUSCULAR | Status: AC
  Administered 2021-06-07: 12.5 mg via INTRAVENOUS

## 2021-06-07 MED ORDER — METOCLOPRAMIDE HCL 5 MG/ML IJ SOLN
10.0000 mg | Freq: Four times a day (QID) | INTRAMUSCULAR | Status: AC
  Administered 2021-06-07 – 2021-06-08 (×4): 10 mg via INTRAVENOUS
  Filled 2021-06-07 (×4): qty 2

## 2021-06-07 MED ORDER — SODIUM CHLORIDE 0.9% IV SOLUTION: Freq: Once | INTRAVENOUS | Status: AC

## 2021-06-07 MED ORDER — DIPHENHYDRAMINE HCL 50 MG/ML IJ SOLN
INTRAMUSCULAR | Status: AC
  Filled 2021-06-07: qty 1

## 2021-06-07 NOTE — Progress Notes (Signed)
1 Day Post-Op Procedure(s) (LRB): HYSTERECTOMY VAGINAL (N/A) OPEN BILATERAL SALPINGECTOMY, VAGINAL (Bilateral) CYSTOSCOPY (N/A)  Subjective: Patient reports abdominal pain.  Denies N/V.  Ambulated with a walker.  Anxious.  Mother recently passed.  Many questions answered.  Objective: I have reviewed patient's vital signs, intake and output, medications, and labs.  General: alert and no distress Resp: clear to auscultation bilaterally Cardio: regular rate and rhythm GI: faint and hypoactive BS, soft abdomen, app tender Extremities: Homans sign is negative, no sign of DVT and SCDs are on Vaginal Bleeding: scant dark dried blood on pad  Assessment: s/p Procedure(s): HYSTERECTOMY VAGINAL (N/A) OPEN BILATERAL SALPINGECTOMY, VAGINAL (Bilateral) CYSTOSCOPY (N/A): stable and anemia  Plan: Continue foley due to blood transfusion; will continue until blood transfusion complete Clear liquids Transfuse 1u PRBCs d/t anemia Check CBC and BMET post transfusion Hypoactive BS - start reglan now Will continue to monitor pain which upon my assessment of pt seemed under fair control although pt reported 9/10 and 10/10 pain over the last few hours Pt understands narcotics may slow bowels down more and ok with plan not to increase narcotics SCDs on for DVT prophylaxis  LOS: 0 days    Delice Lesch 06/07/2021, 4:06 AM

## 2021-06-07 NOTE — Progress Notes (Signed)
Pt arrived from PACU to 1324. Pt transferred from stretcher to bed w/ min assist. Report given at bedside by Wells Guiles RN/PACU. POC initiated and call bell reviewed for needs/safety. Pain medication and schedules were reviewed w/pt.  Pt handbook at bedside for review. Safety video initiated on TV.

## 2021-06-07 NOTE — Progress Notes (Signed)
Dr. Mancel Bale notified of PT's temp 99.9 prior to blood transfusion. Order received for Benadryl 12.5 IV. Pt has received tylenol and intructed on IS

## 2021-06-07 NOTE — Progress Notes (Addendum)
Raven Harper is a37 y.o.  [id number]  Post Op Date # 1  Subjective: Patient is Doing well postoperatively. Patient has Pain is controlled with current analgesics. Medications being used: acetaminophen and narcotic analgesics including oxycodone. Ambulated in the hall earlier this morning without difficulty, tolerating liquids and Foely remains in place with approximately 800 cc of clear urine. Patient receiving a unit of PRBC.  Has not passed flatus. Diet:  Objective: Vital signs in last 24 hours: Temp:  [96.8 F (36 C)-100.3 F (37.9 C)] 99.2 F (37.3 C) (06/14 0521) Pulse Rate:  [85-113] 106 (06/14 0521) Resp:  [9-18] 15 (06/14 0521) BP: (97-142)/(51-96) 130/83 (06/14 0521) SpO2:  [97 %-100 %] 99 % (06/14 0521) Weight:  [72.5 kg] 72.5 kg (06/13 1137)  Intake/Output from previous day: 06/13 0701 - 06/14 0700 In: 7157.8 [P.O.:480; I.V.:4942.8] Out: 6450 [Urine:4650] Intake/Output this shift: No intake/output data recorded. Recent Labs  Lab 06/02/21 1300 06/06/21 1618 06/06/21 1840 06/06/21 2322  WBC 6.4  --  11.9* 10.2  HGB 8.2* 8.2* 8.1* 7.4*  HCT 28.4* 24.0* 27.4* 23.8*  PLT 337  --  182 206     Recent Labs  Lab 06/02/21 1300 06/06/21 1618  NA 137 137  K 3.9 4.3  CL 106 104  CO2 23  --   BUN 7 5*  CREATININE 0.84 0.60  CALCIUM 9.0  --   GLUCOSE 123* 119*    EXAM: General: alert, cooperative, fatigued, and no distress Resp: clear to auscultation bilaterally Cardio: regular rate and rhythm, S1, S2 normal, no murmur, click, rub or gallop GI: abdomen distended, soft with diffuse tenderness without guarding or rebound; faint bubbly bowel sounds in all 4 quadrants Extremities: Homans sign is negative, no sign of DVT and SCD hose on and functioning-no calf tenderness.   Assessment: s/p Procedure(s): HYSTERECTOMY VAGINAL OPEN BILATERAL SALPINGECTOMY, VAGINAL CYSTOSCOPY: stable, progressing well, and anemia Sluggish Bowel Function  Plan: Advance  diet Encourage ambulation Routine care  LOS: 0 days    Raven Regal, PA-C 06/07/2021 7:28 AM  Pt seen at about 1800 and overall doing well but had not passed flatus and pt wanted to stay another night although tolerating regular diet, ambulating without difficulty, NABS and voiding without difficulty.  Plan to discharge in am.

## 2021-06-08 DIAGNOSIS — D259 Leiomyoma of uterus, unspecified: Secondary | ICD-10-CM | POA: Diagnosis not present

## 2021-06-08 LAB — SURGICAL PATHOLOGY

## 2021-06-08 MED ORDER — IBUPROFEN 600 MG PO TABS: ORAL_TABLET | ORAL | 1 refills | Status: AC

## 2021-06-08 MED ORDER — OXYCODONE-ACETAMINOPHEN 5-325 MG PO TABS: ORAL_TABLET | ORAL | 0 refills | Status: AC

## 2021-06-08 NOTE — Plan of Care (Signed)
All discharge teachings were done. Pt verbalized understanding.

## 2021-06-08 NOTE — Discharge Summary (Signed)
Physician Discharge Summary  Patient ID: Raven Harper MRN: [id number] DOB/AGE: Apr 08, 1976 45 y.o.  Admit date: 06/06/2021 Discharge date: 06/08/2021   Discharge Diagnoses:  Active Problems:   Fibroids   Uterine fibroid Menorrhagia Anemia  Operation: Total Vaginal Hysterectomy, Uterine Morcellation, Bilateral Salpingectomy and Cystoscopy   Discharged Condition: good  Hospital Course: On the date of admission the patient underwent the aforementioned procedures and tolerated them well.  Post operative course was unremarkable with the patient resuming bowel and bladder function by post operative day #2 and was therefore deemed ready for discharge home.  Discharge hemoglobin, following transfusion of 4 units of packed red blood cells (intraoperatively and post operatively) was 8.3.  Disposition: Discharge disposition: 01-Home or Self Care       Discharge Medications:  Allergies as of 06/08/2021   No Known Allergies      Medication List     STOP taking these medications    meloxicam 15 MG tablet Commonly known as: MOBIC   norethindrone 5 MG tablet Commonly known as: AYGESTIN   UNABLE TO FIND       TAKE these medications    cyclobenzaprine 10 MG tablet Commonly known as: FLEXERIL Take 10 mg by mouth daily as needed for muscle spasms.   diphenhydrAMINE 25 mg capsule Commonly known as: BENADRYL Take 25 mg by mouth every 6 (six) hours as needed for allergies.   fluticasone 50 MCG/ACT nasal spray Commonly known as: FLONASE Place 1 spray into both nostrils daily as needed for allergies.   ibuprofen 600 MG tablet Commonly known as: ADVIL take 1 tablet po pc every 6 hours for 5 days then prn-post operative pain   oxyCODONE-acetaminophen 5-325 MG tablet Commonly known as: PERCOCET/ROXICET take 1 tablet po every 6 hours as needed for breakthrough post operative pain What changed:  how much to take how to take this when to take this reasons to take  this additional instructions   traZODone 50 MG tablet Commonly known as: DESYREL Take 50 mg by mouth at bedtime.       ASK your doctor about these medications    sodium chloride 0.65 % Soln nasal spray Commonly known as: OCEAN Place 1 spray into both nostrils as needed for congestion.           Follow-up: Dr. Harvie Bridge. Mancel Bale on June 28, 2021 at 4:30 pm and July 11, 2021 at 2:45 p.m.    Signed: Earnstine Regal, PA-C 06/08/2021, 7:29 AM

## 2021-06-08 NOTE — Progress Notes (Signed)
Raven Harper is a64 y.o.  [id number]  Post Op Date # 2 TVH/BS/Cystoscopy  Subjective: Patient is Doing well postoperatively. Patient has Pain is controlled with current analgesics. Medications being used: acetaminophen and narcotic analgesics including oxycodone., Ambulating without difficulty, voiding, passing flatus and tolerating a regular diet.   Objective: Vital signs in last 24 hours: Temp:  [98.2 F (36.8 C)-100 F (37.8 C)] 98.9 F (37.2 C) (06/15 0446) Pulse Rate:  [89-97] 97 (06/15 0446) Resp:  [16-18] 16 (06/15 0446) BP: (118-135)/(79-90) 118/79 (06/15 0446) SpO2:  [98 %-100 %] 99 % (06/15 0446)  Intake/Output from previous day: 06/14 0701 - 06/15 0700 In: 1584.8 [P.O.:240; I.V.:864.8] Out: 2100 [Urine:2100] Intake/Output this shift: No intake/output data recorded. Recent Labs  Lab 06/06/21 1840 06/06/21 2322 06/07/21 1146  WBC 11.9* 10.2 12.3*  HGB 8.1* 7.4* 8.3*  HCT 27.4* 23.8* 25.4*  PLT 182 206 208     Recent Labs  Lab 06/02/21 1300 06/06/21 1618 06/07/21 1146  NA 137 137 137  K 3.9 4.3 3.8  CL 106 104 107  CO2 23  --  25  BUN 7 5* 7  CREATININE 0.84 0.60 0.68  CALCIUM 9.0  --  8.8*  GLUCOSE 123* 119* 190*    EXAM: General: alert and no distress Resp: clear to auscultation bilaterally Cardio: regular rate and rhythm, S1, S2 normal, no murmur, click, rub or gallop GI: bowel sounds present and soft Extremities: extremities normal, atraumatic, no cyanosis or edema and no calf tenderness/   Assessment: s/p Procedure(s): HYSTERECTOMY VAGINAL OPEN BILATERAL SALPINGECTOMY, VAGINAL CYSTOSCOPY: stable, progressing well, tolerating diet, and anemia  Plan: Discharge home  LOS: 0 days    Earnstine Regal, PA-C 06/08/2021 7:12 AM

## 2021-06-08 NOTE — Progress Notes (Signed)
Pt requested prescription for Flexiril, MD is notified via secure chat.

## 2021-06-08 NOTE — Plan of Care (Signed)
  Problem: Activity: Goal: Risk for activity intolerance will decrease Outcome: Progressing   Problem: Nutrition: Goal: Adequate nutrition will be maintained Outcome: Progressing   Problem: Coping: Goal: Level of anxiety will decrease Outcome: Progressing   Problem: Pain Managment: Goal: General experience of comfort will improve Outcome: Progressing   

## 2021-06-10 LAB — TYPE AND SCREEN
ABO/RH(D): A POS
Antibody Screen: NEGATIVE
Unit division: 0
Unit division: 0
Unit division: 0
Unit division: 0

## 2021-06-10 LAB — BPAM RBC
Blood Product Expiration Date: 202206232359
Blood Product Expiration Date: 202206232359
Blood Product Expiration Date: 202207032359
Blood Product Expiration Date: 202207032359
ISSUE DATE / TIME: 202206131409
ISSUE DATE / TIME: 202206131409
ISSUE DATE / TIME: 202206131608
ISSUE DATE / TIME: 202206140457
Unit Type and Rh: 6200
Unit Type and Rh: 6200
Unit Type and Rh: 6200
Unit Type and Rh: 6200

## 2021-07-08 ENCOUNTER — Other Ambulatory Visit: Payer: Self-pay

## 2021-07-08 ENCOUNTER — Encounter: Payer: Self-pay | Admitting: Family

## 2021-07-08 ENCOUNTER — Inpatient Hospital Stay (HOSPITAL_BASED_OUTPATIENT_CLINIC_OR_DEPARTMENT_OTHER): Payer: 59 | Admitting: Family

## 2021-07-08 ENCOUNTER — Inpatient Hospital Stay: Payer: 59 | Attending: Family

## 2021-07-08 ENCOUNTER — Telehealth: Payer: Self-pay

## 2021-07-08 VITALS — BP 119/82 | HR 84 | Temp 98.0°F | Resp 16 | Wt 156.4 lb

## 2021-07-08 DIAGNOSIS — N921 Excessive and frequent menstruation with irregular cycle: Secondary | ICD-10-CM | POA: Diagnosis present

## 2021-07-08 DIAGNOSIS — D500 Iron deficiency anemia secondary to blood loss (chronic): Secondary | ICD-10-CM

## 2021-07-08 LAB — CBC WITH DIFFERENTIAL (CANCER CENTER ONLY)
Abs Immature Granulocytes: 0.04 10*3/uL (ref 0.00–0.07)
Basophils Absolute: 0 10*3/uL (ref 0.0–0.1)
Basophils Relative: 0 %
Eosinophils Absolute: 0.1 10*3/uL (ref 0.0–0.5)
Eosinophils Relative: 1 %
HCT: 36 % (ref 36.0–46.0)
Hemoglobin: 11.1 g/dL — ABNORMAL LOW (ref 12.0–15.0)
Immature Granulocytes: 1 %
Lymphocytes Relative: 38 %
Lymphs Abs: 2 10*3/uL (ref 0.7–4.0)
MCH: 24.7 pg — ABNORMAL LOW (ref 26.0–34.0)
MCHC: 30.8 g/dL (ref 30.0–36.0)
MCV: 80.2 fL (ref 80.0–100.0)
Monocytes Absolute: 0.4 10*3/uL (ref 0.1–1.0)
Monocytes Relative: 8 %
Neutro Abs: 2.6 10*3/uL (ref 1.7–7.7)
Neutrophils Relative %: 52 %
Platelet Count: 247 10*3/uL (ref 150–400)
RBC: 4.49 MIL/uL (ref 3.87–5.11)
RDW: 19.8 % — ABNORMAL HIGH (ref 11.5–15.5)
WBC Count: 5.1 10*3/uL (ref 4.0–10.5)
nRBC: 0 % (ref 0.0–0.2)

## 2021-07-08 LAB — RETICULOCYTES
Immature Retic Fract: 17.5 % — ABNORMAL HIGH (ref 2.3–15.9)
RBC.: 4.52 MIL/uL (ref 3.87–5.11)
Retic Count, Absolute: 50.2 10*3/uL (ref 19.0–186.0)
Retic Ct Pct: 1.1 % (ref 0.4–3.1)

## 2021-07-08 LAB — IRON AND TIBC
Iron: 79 ug/dL (ref 41–142)
Saturation Ratios: 16 % — ABNORMAL LOW (ref 21–57)
TIBC: 493 ug/dL — ABNORMAL HIGH (ref 236–444)
UIBC: 413 ug/dL — ABNORMAL HIGH (ref 120–384)

## 2021-07-08 LAB — FERRITIN: Ferritin: 16 ng/mL (ref 11–307)

## 2021-07-08 NOTE — Progress Notes (Signed)
Hematology and Oncology Follow Up Visit  Raven Harper [id number] [birth year] 45 y.o. 07/08/2021   Principle Diagnosis:  Iron deficiency anemia secondary to heavy cycles  Current Therapy:   IV iron as indicated    Interim History:  Raven Harper is here today for follow-up. She is doing well and has recuperated nicely from her total hysterectomy on 06/06/2021. She did require 2 units of blood and albumin.  Hgb is now 11.1, MCV 80, platelets 247 and WBC count 5.1.  She has not noted any blood loss. No bruising or petechiae.  She has had a little left lower abdominal discomfort that comes and goes. She states that she plans to let her surgeon know at her follow-up next week on Monday.  No fever, chills, n/v, cough, rash, dizziness, SOB, chest pain, palpitations or changes in bowel or bladder habits.  No swelling, tenderness, numbness or tingling in her extremities.  No falls or syncope to report.  Her appetite is good and she is doing her best to stay well hydrated. Her weight is stable at 159 lbs.   ECOG Performance Status: 1 - Symptomatic but completely ambulatory  Medications:  Allergies as of 07/08/2021   No Known Allergies      Medication List        Accurate as of July 08, 2021  9:58 AM. If you have any questions, ask your nurse or doctor.          cyclobenzaprine 10 MG tablet Commonly known as: FLEXERIL Take 10 mg by mouth daily as needed for muscle spasms.   diphenhydrAMINE 25 mg capsule Commonly known as: BENADRYL Take 25 mg by mouth every 6 (six) hours as needed for allergies.   fluticasone 50 MCG/ACT nasal spray Commonly known as: FLONASE Place 1 spray into both nostrils daily as needed for allergies.   ibuprofen 600 MG tablet Commonly known as: ADVIL take 1 tablet po pc every 6 hours for 5 days then prn-post operative pain   oxyCODONE-acetaminophen 5-325 MG tablet Commonly known as: PERCOCET/ROXICET take 1 tablet po every 6 hours as needed for  breakthrough post operative pain   sodium chloride 0.65 % Soln nasal spray Commonly known as: OCEAN Place 1 spray into both nostrils as needed for congestion. What changed: when to take this   traZODone 50 MG tablet Commonly known as: DESYREL Take 50 mg by mouth at bedtime.        Allergies: No Known Allergies  Past Medical History, Surgical history, Social history, and Family History were reviewed and updated.  Review of Systems: All other 10 point review of systems is negative.   Physical Exam:  vitals were not taken for this visit.   Wt Readings from Last 3 Encounters:  06/06/21 159 lb 14.4 oz (72.5 kg)  06/02/21 159 lb 2 oz (72.2 kg)  05/27/21 154 lb 12.8 oz (70.2 kg)    Ocular: Sclerae unicteric, pupils equal, round and reactive to light Ear-nose-throat: Oropharynx clear, dentition fair Lymphatic: No cervical or supraclavicular adenopathy Lungs no rales or rhonchi, good excursion bilaterally Heart regular rate and rhythm, no murmur appreciated Abd soft, nontender, positive bowel sounds MSK no focal spinal tenderness, no joint edema Neuro: non-focal, well-oriented, appropriate affect Breasts: Deferred   Lab Results  Component Value Date   WBC 12.3 (H) 06/07/2021   HGB 8.3 (L) 06/07/2021   HCT 25.4 (L) 06/07/2021   MCV 77.9 (L) 06/07/2021   PLT 208 06/07/2021   Lab Results  Component Value Date  FERRITIN 17 05/27/2021   IRON 16 (L) 05/27/2021   TIBC 581 (H) 05/27/2021   UIBC 566 (H) 05/27/2021   IRONPCTSAT 3 (L) 05/27/2021   Lab Results  Component Value Date   RETICCTPCT 1.6 05/27/2021   RBC 3.26 (L) 06/07/2021   No results found for: KPAFRELGTCHN, LAMBDASER, KAPLAMBRATIO No results found for: IGGSERUM, IGA, IGMSERUM No results found for: [name], A1GS, A2GS, Tillman Sers, SPEI   Chemistry      Component Value Date/Time   NA 137 06/07/2021 1146   K 3.8 06/07/2021 1146   CL 107 06/07/2021 1146   CO2 25  06/07/2021 1146   BUN 7 06/07/2021 1146   CREATININE 0.68 06/07/2021 1146   CREATININE 0.82 05/27/2021 0844      Component Value Date/Time   CALCIUM 8.8 (L) 06/07/2021 1146   ALKPHOS 28 (L) 05/27/2021 0844   AST 33 05/27/2021 0844   ALT 23 05/27/2021 0844   BILITOT 0.2 (L) 05/27/2021 0844       Impression and Plan: Raven Harper is a very pleasant 45 yo female with a large uterine fibroid and heavy, irregular cycle. She had a hysterectomy in June and has recuperated nicely so far.  Hgb is much improved. Iron studies are pending. We can replace if needed.  Follow-up in 3 months.  She can contact our office with any questions or concerns.   Raven Peace, NP 7/15/20229:58 AM

## 2021-07-12 ENCOUNTER — Inpatient Hospital Stay: Payer: 59

## 2021-07-12 ENCOUNTER — Other Ambulatory Visit: Payer: Self-pay

## 2021-07-12 VITALS — BP 102/72 | HR 85 | Temp 98.0°F | Resp 17

## 2021-07-12 DIAGNOSIS — D500 Iron deficiency anemia secondary to blood loss (chronic): Secondary | ICD-10-CM

## 2021-07-12 MED ORDER — SODIUM CHLORIDE 0.9 % IV SOLN
Freq: Once | INTRAVENOUS | Status: AC
  Filled 2021-07-12: qty 250

## 2021-07-12 MED ORDER — SODIUM CHLORIDE 0.9 % IV SOLN
125.0000 mg | Freq: Once | INTRAVENOUS | Status: AC
  Administered 2021-07-12: 125 mg via INTRAVENOUS
  Filled 2021-07-12: qty 125

## 2021-07-12 NOTE — Patient Instructions (Signed)

## 2021-07-15 ENCOUNTER — Inpatient Hospital Stay: Payer: 59

## 2021-07-15 ENCOUNTER — Other Ambulatory Visit: Payer: Self-pay

## 2021-07-15 VITALS — BP 122/90 | HR 98 | Temp 98.4°F | Resp 17

## 2021-07-15 DIAGNOSIS — D500 Iron deficiency anemia secondary to blood loss (chronic): Secondary | ICD-10-CM

## 2021-07-15 MED ORDER — SODIUM CHLORIDE 0.9 % IV SOLN
Freq: Once | INTRAVENOUS | Status: AC
Start: 2021-07-15 — End: 2021-07-15
  Filled 2021-07-15: qty 250

## 2021-07-15 MED ORDER — SODIUM CHLORIDE 0.9 % IV SOLN
125.0000 mg | Freq: Once | INTRAVENOUS | Status: AC
  Administered 2021-07-15: 125 mg via INTRAVENOUS
  Filled 2021-07-15: qty 125

## 2021-08-16 ENCOUNTER — Telehealth: Payer: Self-pay | Admitting: *Deleted

## 2021-08-16 NOTE — Telephone Encounter (Signed)
Called and lvm of rescheduling appointment from 09/30/21 to 09/29/21 with Judson Roch - Requested call back to confirm - mailed calendar

## 2021-09-29 ENCOUNTER — Telehealth: Payer: Self-pay | Admitting: *Deleted

## 2021-09-29 ENCOUNTER — Inpatient Hospital Stay: Payer: 59 | Admitting: Family

## 2021-09-29 ENCOUNTER — Inpatient Hospital Stay: Payer: 59 | Attending: Family

## 2021-09-29 ENCOUNTER — Other Ambulatory Visit: Payer: Self-pay

## 2021-09-29 DIAGNOSIS — N921 Excessive and frequent menstruation with irregular cycle: Secondary | ICD-10-CM | POA: Insufficient documentation

## 2021-09-29 DIAGNOSIS — D500 Iron deficiency anemia secondary to blood loss (chronic): Secondary | ICD-10-CM | POA: Diagnosis present

## 2021-09-29 LAB — CBC WITH DIFFERENTIAL (CANCER CENTER ONLY)
Abs Immature Granulocytes: 0.02 10*3/uL (ref 0.00–0.07)
Basophils Absolute: 0 10*3/uL (ref 0.0–0.1)
Basophils Relative: 0 %
Eosinophils Absolute: 0.1 10*3/uL (ref 0.0–0.5)
Eosinophils Relative: 1 %
HCT: 37.4 % (ref 36.0–46.0)
Hemoglobin: 12.6 g/dL (ref 12.0–15.0)
Immature Granulocytes: 0 %
Lymphocytes Relative: 25 %
Lymphs Abs: 1.4 10*3/uL (ref 0.7–4.0)
MCH: 28.6 pg (ref 26.0–34.0)
MCHC: 33.7 g/dL (ref 30.0–36.0)
MCV: 84.8 fL (ref 80.0–100.0)
Monocytes Absolute: 0.4 10*3/uL (ref 0.1–1.0)
Monocytes Relative: 7 %
Neutro Abs: 3.6 10*3/uL (ref 1.7–7.7)
Neutrophils Relative %: 67 %
Platelet Count: 274 10*3/uL (ref 150–400)
RBC: 4.41 MIL/uL (ref 3.87–5.11)
RDW: 15.9 % — ABNORMAL HIGH (ref 11.5–15.5)
WBC Count: 5.4 10*3/uL (ref 4.0–10.5)
nRBC: 0 % (ref 0.0–0.2)

## 2021-09-29 LAB — RETICULOCYTES
Immature Retic Fract: 13.3 % (ref 2.3–15.9)
RBC.: 4.37 MIL/uL (ref 3.87–5.11)
Retic Count, Absolute: 63.8 10*3/uL (ref 19.0–186.0)
Retic Ct Pct: 1.5 % (ref 0.4–3.1)

## 2021-09-29 NOTE — Telephone Encounter (Signed)
Called patient to reschedule appointment - patient wants to have labs to check iron level

## 2021-09-30 ENCOUNTER — Ambulatory Visit: Payer: 59 | Admitting: Family

## 2021-09-30 ENCOUNTER — Other Ambulatory Visit: Payer: 59

## 2021-09-30 LAB — IRON AND TIBC
Iron: 95 ug/dL (ref 41–142)
Saturation Ratios: 26 % (ref 21–57)
TIBC: 366 ug/dL (ref 236–444)
UIBC: 271 ug/dL (ref 120–384)

## 2021-09-30 LAB — FERRITIN: Ferritin: 36 ng/mL (ref 11–307)

## 2021-10-20 ENCOUNTER — Inpatient Hospital Stay: Payer: 59

## 2021-10-20 ENCOUNTER — Inpatient Hospital Stay (HOSPITAL_BASED_OUTPATIENT_CLINIC_OR_DEPARTMENT_OTHER): Payer: 59 | Admitting: Family

## 2021-10-20 ENCOUNTER — Encounter: Payer: Self-pay | Admitting: Family

## 2021-10-20 ENCOUNTER — Other Ambulatory Visit: Payer: Self-pay

## 2021-10-20 VITALS — BP 115/84 | HR 95 | Temp 99.2°F | Resp 17 | Wt 156.0 lb

## 2021-10-20 DIAGNOSIS — D500 Iron deficiency anemia secondary to blood loss (chronic): Secondary | ICD-10-CM

## 2021-10-20 NOTE — Progress Notes (Signed)
Hematology and Oncology Follow Up Visit  Raven Harper [id number] 1976/01/09 45 y.o. 10/20/2021   Principle Diagnosis:  Iron deficiency anemia secondary to heavy cycles   Current Therapy:        IV iron as indicated    Interim History:  Raven Harper is here today for follow-up. She came in 3 weeks ago for lab work and iron studies were improved with saturation 26% and ferritin 36.   She is feeling fatigue but states that Raven Harper has not been sleeping well and keeping Raven up at night.  No blood loss noted. She has recuperated nicely from Raven hysterectomy in June.  No bruising or petechiae.  No fever, chills, n/v, cough, rash, dizziness, SOB, chest pain, palpitations, abdominal pain or changes in bowel or bladder habits.  No swelling, tenderness in Raven extremities.  She has occasional tingling in Raven fingertips and toes when cold.  No falls or syncope to report.  She has maintained a good appetite and is staying well hydrated. Raven weight is stable at 156 lbs.   ECOG Performance Status: 1 - Symptomatic but completely ambulatory  Medications:  Allergies as of 10/20/2021   No Known Allergies      Medication List        Accurate as of October 20, 2021  9:04 AM. If you have any questions, ask your nurse or doctor.          cyclobenzaprine 10 MG tablet Commonly known as: FLEXERIL Take 10 mg by mouth daily as needed for muscle spasms.   diphenhydrAMINE 25 mg capsule Commonly known as: BENADRYL Take 25 mg by mouth every 6 (six) hours as needed for allergies.   fluticasone 50 MCG/ACT nasal spray Commonly known as: FLONASE Place 1 spray into both nostrils daily as needed for allergies.   ibuprofen 600 MG tablet Commonly known as: ADVIL take 1 tablet po pc every 6 hours for 5 days then prn-post operative pain   oxyCODONE-acetaminophen 5-325 MG tablet Commonly known as: PERCOCET/ROXICET take 1 tablet po every 6 hours as needed for breakthrough post operative pain    sodium chloride 0.65 % Soln nasal spray Commonly known as: OCEAN Place 1 spray into both nostrils as needed for congestion. What changed: when to take this   traZODone 50 MG tablet Commonly known as: DESYREL Take 50 mg by mouth at bedtime.        Allergies: No Known Allergies  Past Medical History, Surgical history, Social history, and Family History were reviewed and updated.  Review of Systems: All other 10 point review of systems is negative.   Physical Exam:  weight is 156 lb (70.8 kg). Raven oral temperature is 99.2 F (37.3 C). Raven blood pressure is 115/84 and Raven pulse is 95. Raven respiration is 17.   Wt Readings from Last 3 Encounters:  10/20/21 156 lb (70.8 kg)  07/08/21 156 lb 6.4 oz (70.9 kg)  06/06/21 159 lb 14.4 oz (72.5 kg)    Ocular: Sclerae unicteric, pupils equal, round and reactive to light Ear-nose-throat: Oropharynx clear, dentition fair Lymphatic: No cervical or supraclavicular adenopathy Lungs no rales or rhonchi, good excursion bilaterally Heart regular rate and rhythm, no murmur appreciated Abd soft, nontender, positive bowel sounds MSK no focal spinal tenderness, no joint edema Neuro: non-focal, well-oriented, appropriate affect Breasts: Deferred   Lab Results  Component Value Date   WBC 5.4 09/29/2021   HGB 12.6 09/29/2021   HCT 37.4 09/29/2021   MCV 84.8 09/29/2021  PLT 274 09/29/2021   Lab Results  Component Value Date   FERRITIN 36 09/29/2021   IRON 95 09/29/2021   TIBC 366 09/29/2021   UIBC 271 09/29/2021   IRONPCTSAT 26 09/29/2021   Lab Results  Component Value Date   RETICCTPCT 1.5 09/29/2021   RBC 4.41 09/29/2021   RBC 4.37 09/29/2021   No results found for: KPAFRELGTCHN, LAMBDASER, KAPLAMBRATIO No results found for: IGGSERUM, IGA, IGMSERUM No results found for: [name], A1GS, A2GS, Tillman Sers, SPEI   Chemistry      Component Value Date/Time   NA 137 06/07/2021 1146   K 3.8  06/07/2021 1146   CL 107 06/07/2021 1146   CO2 25 06/07/2021 1146   BUN 7 06/07/2021 1146   CREATININE 0.68 06/07/2021 1146   CREATININE 0.82 05/27/2021 0844      Component Value Date/Time   CALCIUM 8.8 (L) 06/07/2021 1146   ALKPHOS 28 (L) 05/27/2021 0844   AST 33 05/27/2021 0844   ALT 23 05/27/2021 0844   BILITOT 0.2 (L) 05/27/2021 0844       Impression and Plan: Raven Harper is a very pleasant 45 Harper female with a large uterine fibroid and heavy, irregular cycle. She had a hysterectomy in June and she continues to do well.  Follow-up in 3 months.  She can contact our office with any questions or concerns.   Lottie Dawson, NP 10/27/20229:04 AM

## 2022-01-20 ENCOUNTER — Inpatient Hospital Stay: Payer: 59

## 2022-01-20 ENCOUNTER — Inpatient Hospital Stay: Payer: 59 | Admitting: Family

## 2022-01-24 ENCOUNTER — Inpatient Hospital Stay: Payer: 59 | Admitting: Family

## 2022-01-24 ENCOUNTER — Other Ambulatory Visit: Payer: 59

## 2022-01-24 ENCOUNTER — Inpatient Hospital Stay: Payer: 59

## 2022-02-01 ENCOUNTER — Encounter: Payer: Self-pay | Admitting: Family

## 2022-02-01 ENCOUNTER — Inpatient Hospital Stay: Payer: 59 | Attending: Hematology & Oncology

## 2022-02-01 ENCOUNTER — Other Ambulatory Visit: Payer: Self-pay

## 2022-02-01 ENCOUNTER — Inpatient Hospital Stay (HOSPITAL_BASED_OUTPATIENT_CLINIC_OR_DEPARTMENT_OTHER): Payer: 59 | Admitting: Family

## 2022-02-01 VITALS — BP 125/77 | HR 88 | Temp 99.5°F | Resp 16 | Wt 160.1 lb

## 2022-02-01 DIAGNOSIS — D500 Iron deficiency anemia secondary to blood loss (chronic): Secondary | ICD-10-CM

## 2022-02-01 DIAGNOSIS — D509 Iron deficiency anemia, unspecified: Secondary | ICD-10-CM | POA: Insufficient documentation

## 2022-02-01 LAB — CBC WITH DIFFERENTIAL (CANCER CENTER ONLY)
Abs Immature Granulocytes: 0 10*3/uL (ref 0.00–0.07)
Basophils Absolute: 0 10*3/uL (ref 0.0–0.1)
Basophils Relative: 0 %
Eosinophils Absolute: 0 10*3/uL (ref 0.0–0.5)
Eosinophils Relative: 1 %
HCT: 38.8 % (ref 36.0–46.0)
Hemoglobin: 13.1 g/dL (ref 12.0–15.0)
Immature Granulocytes: 0 %
Lymphocytes Relative: 35 %
Lymphs Abs: 1.8 10*3/uL (ref 0.7–4.0)
MCH: 30.6 pg (ref 26.0–34.0)
MCHC: 33.8 g/dL (ref 30.0–36.0)
MCV: 90.7 fL (ref 80.0–100.0)
Monocytes Absolute: 0.4 10*3/uL (ref 0.1–1.0)
Monocytes Relative: 8 %
Neutro Abs: 2.9 10*3/uL (ref 1.7–7.7)
Neutrophils Relative %: 56 %
Platelet Count: 224 10*3/uL (ref 150–400)
RBC: 4.28 MIL/uL (ref 3.87–5.11)
RDW: 11.9 % (ref 11.5–15.5)
WBC Count: 5.1 10*3/uL (ref 4.0–10.5)
nRBC: 0 % (ref 0.0–0.2)

## 2022-02-01 LAB — IRON AND IRON BINDING CAPACITY (CC-WL,HP ONLY)
Iron: 87 ug/dL (ref 28–170)
Saturation Ratios: 21 % (ref 10.4–31.8)
TIBC: 423 ug/dL (ref 250–450)
UIBC: 336 ug/dL (ref 148–442)

## 2022-02-01 LAB — RETICULOCYTES
Immature Retic Fract: 11 % (ref 2.3–15.9)
RBC.: 4.33 MIL/uL (ref 3.87–5.11)
Retic Count, Absolute: 62.4 10*3/uL (ref 19.0–186.0)
Retic Ct Pct: 1.4 % (ref 0.4–3.1)

## 2022-02-01 LAB — FERRITIN: Ferritin: 54 ng/mL (ref 11–307)

## 2022-02-01 NOTE — Progress Notes (Signed)
Hematology and Oncology Follow Up Visit  Raven Harper [id number] [birth year] 46 y.o. 02/01/2022   Principle Diagnosis:  Iron deficiency anemia secondary to heavy cycles   Current Therapy:        IV iron as indicated    Interim History:  Raven Harper is here today for follow-up. She is doing quite well and has no complaints at this time.  No blood loss, bruising or petechiae.  No fever, chills, n/v, cough, rash, dizziness, SOB, chest pain, palpitations, abdominal pain or changes in bowel or bladder habits.  No swelling, tenderness in her extremities.  She has tingling off and on in her fingertips and feet with possible Raynaud's.  No falls or syncope.  She has maintained a good appetite and is staying well hydrated. Her weight is stable at 160 lbs.   ECOG Performance Status: 0 - Asymptomatic  Medications:  Allergies as of 02/01/2022   No Known Allergies      Medication List        Accurate as of February 01, 2022 10:33 AM. If you have any questions, ask your nurse or doctor.          cyclobenzaprine 10 MG tablet Commonly known as: FLEXERIL Take 10 mg by mouth daily as needed for muscle spasms.   diphenhydrAMINE 25 mg capsule Commonly known as: BENADRYL Take 25 mg by mouth every 6 (six) hours as needed for allergies.   fluticasone 50 MCG/ACT nasal spray Commonly known as: FLONASE Place 1 spray into both nostrils daily as needed for allergies.   ibuprofen 600 MG tablet Commonly known as: ADVIL take 1 tablet po pc every 6 hours for 5 days then prn-post operative pain   oxyCODONE-acetaminophen 5-325 MG tablet Commonly known as: PERCOCET/ROXICET take 1 tablet po every 6 hours as needed for breakthrough post operative pain   sodium chloride 0.65 % Soln nasal spray Commonly known as: OCEAN Place 1 spray into both nostrils as needed for congestion. What changed: when to take this   traZODone 50 MG tablet Commonly known as: DESYREL Take 50 mg by mouth at  bedtime.        Allergies: No Known Allergies  Past Medical History, Surgical history, Social history, and Family History were reviewed and updated.  Review of Systems: All other 10 point review of systems is negative.   Physical Exam:  weight is 160 lb 1.9 oz (72.6 kg). Her oral temperature is 99.5 F (37.5 C). Her blood pressure is 125/77 and her pulse is 88. Her respiration is 16 and oxygen saturation is 100%.   Wt Readings from Last 3 Encounters:  02/01/22 160 lb 1.9 oz (72.6 kg)  10/20/21 156 lb (70.8 kg)  07/08/21 156 lb 6.4 oz (70.9 kg)    Ocular: Sclerae unicteric, pupils equal, round and reactive to light Ear-nose-throat: Oropharynx clear, dentition fair Lymphatic: No cervical or supraclavicular adenopathy Lungs no rales or rhonchi, good excursion bilaterally Heart regular rate and rhythm, no murmur appreciated Abd soft, nontender, positive bowel sounds MSK no focal spinal tenderness, no joint edema Neuro: non-focal, well-oriented, appropriate affect Breasts: Deferred   Lab Results  Component Value Date   WBC 5.1 02/01/2022   HGB 13.1 02/01/2022   HCT 38.8 02/01/2022   MCV 90.7 02/01/2022   PLT 224 02/01/2022   Lab Results  Component Value Date   FERRITIN 36 09/29/2021   IRON 95 09/29/2021   TIBC 366 09/29/2021   UIBC 271 09/29/2021   IRONPCTSAT 26 09/29/2021   Lab  Results  Component Value Date   RETICCTPCT 1.4 02/01/2022   RBC 4.33 02/01/2022   No results found for: KPAFRELGTCHN, LAMBDASER, KAPLAMBRATIO No results found for: IGGSERUM, IGA, IGMSERUM No results found for: [name], A1GS, A2GS, Tillman Sers, SPEI   Chemistry      Component Value Date/Time   NA 137 06/07/2021 1146   K 3.8 06/07/2021 1146   CL 107 06/07/2021 1146   CO2 25 06/07/2021 1146   BUN 7 06/07/2021 1146   CREATININE 0.68 06/07/2021 1146   CREATININE 0.82 05/27/2021 0844      Component Value Date/Time   CALCIUM 8.8 (L) 06/07/2021 1146    ALKPHOS 28 (L) 05/27/2021 0844   AST 33 05/27/2021 0844   ALT 23 05/27/2021 0844   BILITOT 0.2 (L) 05/27/2021 0844       Impression and Plan: Raven Harper is a very pleasant 46 yo female with a large uterine fibroid and heavy, irregular cycle. She had a hysterectomy in June 2022 and she continues to do well.  Iron studies are pending.  Follow-up now as needed.   Lottie Dawson, NP 2/8/202310:33 AM

## 2022-02-07 ENCOUNTER — Telehealth: Payer: Self-pay | Admitting: *Deleted

## 2022-02-07 NOTE — Telephone Encounter (Signed)
Follow up as needed

## 2023-01-23 ENCOUNTER — Other Ambulatory Visit: Payer: Self-pay | Admitting: Obstetrics and Gynecology

## 2023-01-23 DIAGNOSIS — E049 Nontoxic goiter, unspecified: Secondary | ICD-10-CM

## 2023-02-01 ENCOUNTER — Ambulatory Visit
Admission: RE | Admit: 2023-02-01 | Discharge: 2023-02-01 | Disposition: A | Discharge status: Home / Self Care | Payer: 59 | Source: Ambulatory Visit | Attending: Obstetrics and Gynecology | Admitting: Obstetrics and Gynecology

## 2023-02-01 DIAGNOSIS — E049 Nontoxic goiter, unspecified: Secondary | ICD-10-CM

## 2023-03-23 ENCOUNTER — Encounter: Payer: Self-pay | Admitting: Family

## 2023-04-20 ENCOUNTER — Ambulatory Visit
Admission: RE | Admit: 2023-04-20 | Discharge: 2023-04-20 | Disposition: A | Discharge status: Home / Self Care | Payer: 59 | Source: Ambulatory Visit | Attending: Urgent Care | Admitting: Urgent Care

## 2023-04-20 ENCOUNTER — Ambulatory Visit (INDEPENDENT_AMBULATORY_CARE_PROVIDER_SITE_OTHER): Payer: 59

## 2023-04-20 VITALS — BP 112/77 | HR 91 | Temp 98.4°F | Resp 18

## 2023-04-20 DIAGNOSIS — R053 Chronic cough: Secondary | ICD-10-CM

## 2023-04-20 DIAGNOSIS — J309 Allergic rhinitis, unspecified: Secondary | ICD-10-CM | POA: Diagnosis not present

## 2023-04-20 DIAGNOSIS — J209 Acute bronchitis, unspecified: Secondary | ICD-10-CM

## 2023-04-20 NOTE — ED Triage Notes (Signed)
Pt reports cough, chest congestion, fatigue x 5 weeks. Pt reports she finished antibiotics yesterday for same complaints.

## 2023-04-20 NOTE — ED Provider Notes (Signed)
[name]  Note:  This document was prepared using Conservation officer, historic buildings and may include unintentional dictation errors.  MRN: [id number] DOB: 10-03-1976  Subjective:   Raven Harper is a 47 y.o. female presenting for recheck on persistent productive cough, chest congestion, fatigue.  Has had 5 weeks of symptoms. Patient was seen and treated from a visit on 04/06/2023, managed for pansinusitis and bacterial bronchitis.  Was prescribed Augmentin and prednisone.  Was also provided with hydrocodone cough syrup, albuterol inhaler, pseudoephedrine. Does not take Flonase or an antihistamine medication. No smoking of any kind including cigarettes, cigars, vaping, marijuana use.    No current facility-administered medications for this encounter.  Current Outpatient Medications:    cyclobenzaprine (FLEXERIL) 10 MG tablet, Take 10 mg by mouth daily as needed for muscle spasms., Disp: , Rfl:    diphenhydrAMINE (BENADRYL) 25 mg capsule, Take 25 mg by mouth every 6 (six) hours as needed for allergies., Disp: , Rfl:    fluticasone (FLONASE) 50 MCG/ACT nasal spray, Place 1 spray into both nostrils daily as needed for allergies., Disp: , Rfl:    ibuprofen (ADVIL) 600 MG tablet, take 1 tablet po pc every 6 hours for 5 days then prn-post operative pain, Disp: 30 tablet, Rfl: 1   oxyCODONE-acetaminophen (PERCOCET/ROXICET) 5-325 MG tablet, take 1 tablet po every 6 hours as needed for breakthrough post operative pain, Disp: 20 tablet, Rfl: 0   sodium chloride (OCEAN) 0.65 % SOLN nasal spray, Place 1 spray into both nostrils as needed for congestion. (Patient taking differently: Place 1 spray into both nostrils daily as needed for congestion.), Disp: 1 Bottle, Rfl: 0   traZODone (DESYREL) 50 MG tablet, Take 50 mg by mouth at bedtime., Disp: , Rfl:    No Known Allergies  Past Medical History:  Diagnosis Date   Anemia    Anxiety    H/O menorrhagia    Heart murmur    no  problems with no cardiologist   History of blood transfusion 02/24/2021   1 unit   Hyperlipidemia    H/O   Seasonal allergies      Past Surgical History:  Procedure Laterality Date   BILATERAL SALPINGECTOMY Bilateral 06/06/2021   Procedure: OPEN BILATERAL SALPINGECTOMY, VAGINAL;  Surgeon: Osborn Coho, MD;  Location: [facility];  Service: Gynecology;  Laterality: Bilateral;   CYSTOSCOPY N/A 06/06/2021   Procedure: CYSTOSCOPY;  Surgeon: Osborn Coho, MD;  Location: [facility];  Service: Gynecology;  Laterality: N/A;   dental implants  2020   DILATATION & CURRETTAGE/HYSTEROSCOPY WITH RESECTOCOPE Bilateral 06/05/2013   Procedure: DILATATION & CURETTAGE/HYSTEROSCOPY WITH RESECTOCOPE;  Surgeon: Esmeralda Arthur, MD;  Location: [facility];  Service: Gynecology;  Laterality: Bilateral;   VAGINAL HYSTERECTOMY N/A 06/06/2021   Procedure: HYSTERECTOMY VAGINAL;  Surgeon: Osborn Coho, MD;  Location: [facility];  Service: Gynecology;  Laterality: N/A;   WISDOM TOOTH EXTRACTION  yrs ago    Family History  Problem Relation Age of Onset   Cancer Father        PROSTATE   Lung cancer Maternal Grandmother    Cancer Maternal Grandmother    Breast cancer Paternal Grandmother    Hypertension Mother    Hyperlipidemia Mother     Social History   Tobacco Use   Smoking status: Never   Smokeless tobacco: Never  Vaping Use   Vaping Use: Never used  Substance Use Topics   Alcohol use: Yes    Alcohol/week:  1.0 standard drink of alcohol    Types: 1 Glasses of wine per week    Comment: 2 -3 glasses wine daily   Drug use: No    ROS   Objective:   Vitals: BP 112/77 (BP Location: Left Arm)   Pulse 91   Temp 98.4 F (36.9 C) (Oral)   Resp 18   LMP 03/25/2021   SpO2 94%   Physical Exam Constitutional:      General: She is not in acute distress.    Appearance: Normal appearance. She is well-developed and normal weight. She is not  ill-appearing, toxic-appearing or diaphoretic.  HENT:     Head: Normocephalic and atraumatic.     Right Ear: Tympanic membrane, ear canal and external ear normal. No drainage or tenderness. No middle ear effusion. There is no impacted cerumen. Tympanic membrane is not erythematous or bulging.     Left Ear: Tympanic membrane, ear canal and external ear normal. No drainage or tenderness.  No middle ear effusion. There is no impacted cerumen. Tympanic membrane is not erythematous or bulging.     Nose: Nose normal. No congestion or rhinorrhea.     Mouth/Throat:     Mouth: Mucous membranes are moist. No oral lesions.     Pharynx: No pharyngeal swelling, oropharyngeal exudate, posterior oropharyngeal erythema or uvula swelling.     Tonsils: No tonsillar exudate or tonsillar abscesses.  Eyes:     General: No scleral icterus.       Right eye: No discharge.        Left eye: No discharge.     Extraocular Movements: Extraocular movements intact.     Right eye: Normal extraocular motion.     Left eye: Normal extraocular motion.     Conjunctiva/sclera: Conjunctivae normal.  Cardiovascular:     Rate and Rhythm: Normal rate and regular rhythm.     Heart sounds: Normal heart sounds. No murmur heard.    No friction rub. No gallop.  Pulmonary:     Effort: Pulmonary effort is normal. No respiratory distress.     Breath sounds: No stridor. No wheezing, rhonchi or rales.  Chest:     Chest wall: No tenderness.  Musculoskeletal:     Cervical back: Normal range of motion and neck supple.  Lymphadenopathy:     Cervical: No cervical adenopathy.  Skin:    General: Skin is warm and dry.  Neurological:     General: No focal deficit present.     Mental Status: She is alert and oriented to person, place, and time.  Psychiatric:        Mood and Affect: Mood normal.        Behavior: Behavior normal.    DG Chest 2 View  Result Date: 04/20/2023 CLINICAL DATA:  Persistent cough, chest congestion EXAM: CHEST -  2 VIEW COMPARISON:  02/10/2015 FINDINGS: Cardiac and mediastinal contours are within normal limits. No focal pulmonary opacity. No pleural effusion or pneumothorax. No acute osseous abnormality. IMPRESSION: No acute cardiopulmonary process. Electronically Signed   By: Wiliam Ke M.D.   On: 04/20/2023 11:57     Assessment and Plan :   PDMP not reviewed this encounter.  1. Persistent cough   2. Allergic rhinitis, unspecified seasonality, unspecified trigger   3. Acute bronchitis, unspecified organism    Over-read was pending at time of discharge. I recommended Flonase, Zyrtec daily. Continue to take pseudoephedrine. Use cough suppression syrup as needed (she declined another prescription for additional cough syrup of  promethazine-DM), albuterol as needed. Counseled patient on potential for adverse effects with medications prescribed/recommended today, ER and return-to-clinic precautions discussed, patient verbalized understanding. Will update diagnoses and treatment plan following x-ray over-read.    Wallis Bamberg, PA-C 04/20/23 1302

## 2023-04-24 ENCOUNTER — Other Ambulatory Visit: Payer: Self-pay

## 2023-04-24 ENCOUNTER — Emergency Department (HOSPITAL_BASED_OUTPATIENT_CLINIC_OR_DEPARTMENT_OTHER)
Admission: EM | Admit: 2023-04-24 | Discharge: 2023-04-24 | Disposition: A | Discharge status: Home / Self Care | Payer: No Typology Code available for payment source | Attending: Emergency Medicine | Admitting: Emergency Medicine

## 2023-04-24 ENCOUNTER — Encounter (HOSPITAL_BASED_OUTPATIENT_CLINIC_OR_DEPARTMENT_OTHER): Payer: Self-pay | Admitting: Pediatrics

## 2023-04-24 DIAGNOSIS — M25512 Pain in left shoulder: Secondary | ICD-10-CM | POA: Diagnosis present

## 2023-04-24 DIAGNOSIS — Y92410 Unspecified street and highway as the place of occurrence of the external cause: Secondary | ICD-10-CM | POA: Insufficient documentation

## 2023-04-24 MED ORDER — LIDOCAINE 5 % EX PTCH: 1.0000 | MEDICATED_PATCH | CUTANEOUS | 0 refills | Status: AC

## 2023-04-24 MED ORDER — LIDOCAINE 5 % EX PTCH
1.0000 | MEDICATED_PATCH | CUTANEOUS | Status: DC
  Administered 2023-04-24: 1 via TRANSDERMAL
  Filled 2023-04-24: qty 1

## 2023-04-24 MED ORDER — METHOCARBAMOL 500 MG PO TABS
500.0000 mg | ORAL_TABLET | Freq: Once | ORAL | Status: AC
  Administered 2023-04-24: 500 mg via ORAL
  Filled 2023-04-24: qty 1

## 2023-04-24 MED ORDER — METHOCARBAMOL 500 MG PO TABS: 500.0000 mg | ORAL_TABLET | Freq: Two times a day (BID) | ORAL | 0 refills | Status: AC
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
## 2023-04-24 NOTE — ED Notes (Signed)
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
## 2023-05-29 ENCOUNTER — Encounter: Payer: Self-pay | Admitting: Family
